# Patient Record
Sex: Female | Born: 1960 | Race: Black or African American | Hispanic: No | Marital: Single | Smoking: Never smoker
Health system: Southern US, Community
[De-identification: ages and names within clinical notes are randomized; demographics above are authoritative.]

## PROBLEM LIST (undated history)

## (undated) DIAGNOSIS — B351 Tinea unguium: Secondary | ICD-10-CM

## (undated) DIAGNOSIS — H269 Unspecified cataract: Secondary | ICD-10-CM

## (undated) DIAGNOSIS — F79 Unspecified intellectual disabilities: Secondary | ICD-10-CM

## (undated) DIAGNOSIS — J45909 Unspecified asthma, uncomplicated: Secondary | ICD-10-CM

## (undated) DIAGNOSIS — E119 Type 2 diabetes mellitus without complications: Secondary | ICD-10-CM

## (undated) DIAGNOSIS — D0512 Intraductal carcinoma in situ of left breast: Secondary | ICD-10-CM

## (undated) DIAGNOSIS — I10 Essential (primary) hypertension: Secondary | ICD-10-CM

## (undated) DIAGNOSIS — E538 Deficiency of other specified B group vitamins: Secondary | ICD-10-CM

## (undated) DIAGNOSIS — C50919 Malignant neoplasm of unspecified site of unspecified female breast: Secondary | ICD-10-CM

## (undated) DIAGNOSIS — E782 Mixed hyperlipidemia: Secondary | ICD-10-CM

## (undated) DIAGNOSIS — F209 Schizophrenia, unspecified: Secondary | ICD-10-CM

## (undated) DIAGNOSIS — Z832 Family history of diseases of the blood and blood-forming organs and certain disorders involving the immune mechanism: Secondary | ICD-10-CM

## (undated) DIAGNOSIS — E118 Type 2 diabetes mellitus with unspecified complications: Secondary | ICD-10-CM

## (undated) DIAGNOSIS — Z923 Personal history of irradiation: Secondary | ICD-10-CM

## (undated) HISTORY — DX: Unspecified intellectual disabilities: F79

## (undated) HISTORY — DX: Schizophrenia, unspecified: F20.9

## (undated) HISTORY — DX: Essential (primary) hypertension: I10

## (undated) HISTORY — DX: Family history of diseases of the blood and blood-forming organs and certain disorders involving the immune mechanism: Z83.2

## (undated) HISTORY — DX: Type 2 diabetes mellitus without complications: E11.9

## (undated) HISTORY — DX: Mixed hyperlipidemia: E78.2

## (undated) HISTORY — DX: Deficiency of other specified B group vitamins: E53.8

## (undated) HISTORY — DX: Unspecified cataract: H26.9

## (undated) HISTORY — PX: ABDOMINAL HYSTERECTOMY: SHX81

## (undated) HISTORY — DX: Malignant neoplasm of unspecified site of unspecified female breast: C50.919

## (undated) HISTORY — DX: Unspecified asthma, uncomplicated: J45.909

---

## 2003-10-31 DIAGNOSIS — S92811B Other fracture of right foot, initial encounter for open fracture: Secondary | ICD-10-CM

## 2003-10-31 HISTORY — DX: Other fracture of right foot, initial encounter for open fracture: S92.811B

## 2004-09-05 ENCOUNTER — Ambulatory Visit: Payer: Self-pay | Admitting: Unknown Physician Specialty

## 2004-12-19 ENCOUNTER — Ambulatory Visit: Payer: Self-pay | Admitting: Unknown Physician Specialty

## 2004-12-28 ENCOUNTER — Ambulatory Visit: Payer: Self-pay | Admitting: Unknown Physician Specialty

## 2005-01-28 ENCOUNTER — Ambulatory Visit: Payer: Self-pay | Admitting: Unknown Physician Specialty

## 2005-02-27 ENCOUNTER — Ambulatory Visit: Payer: Self-pay | Admitting: Unknown Physician Specialty

## 2005-03-30 ENCOUNTER — Ambulatory Visit: Payer: Self-pay | Admitting: Unknown Physician Specialty

## 2005-04-29 ENCOUNTER — Ambulatory Visit: Payer: Self-pay | Admitting: Unknown Physician Specialty

## 2005-05-30 ENCOUNTER — Ambulatory Visit: Payer: Self-pay | Admitting: Unknown Physician Specialty

## 2005-06-30 ENCOUNTER — Ambulatory Visit: Payer: Self-pay | Admitting: Unknown Physician Specialty

## 2005-07-30 ENCOUNTER — Ambulatory Visit: Payer: Self-pay | Admitting: Unknown Physician Specialty

## 2005-09-14 ENCOUNTER — Ambulatory Visit: Payer: Self-pay | Admitting: Unknown Physician Specialty

## 2006-09-18 ENCOUNTER — Ambulatory Visit: Payer: Self-pay | Admitting: Unknown Physician Specialty

## 2007-10-01 ENCOUNTER — Ambulatory Visit: Payer: Self-pay | Admitting: Unknown Physician Specialty

## 2009-02-10 ENCOUNTER — Ambulatory Visit: Payer: Self-pay | Admitting: Unknown Physician Specialty

## 2010-03-10 ENCOUNTER — Ambulatory Visit: Payer: Self-pay | Admitting: Unknown Physician Specialty

## 2010-03-31 ENCOUNTER — Ambulatory Visit: Payer: Self-pay | Admitting: Unknown Physician Specialty

## 2012-08-15 ENCOUNTER — Ambulatory Visit: Payer: Self-pay | Admitting: Unknown Physician Specialty

## 2012-10-30 DIAGNOSIS — C50919 Malignant neoplasm of unspecified site of unspecified female breast: Secondary | ICD-10-CM

## 2012-10-30 HISTORY — PX: BREAST EXCISIONAL BIOPSY: SUR124

## 2012-10-30 HISTORY — PX: BREAST LUMPECTOMY: SHX2

## 2012-10-30 HISTORY — DX: Malignant neoplasm of unspecified site of unspecified female breast: C50.919

## 2012-10-30 HISTORY — PX: BREAST BIOPSY: SHX20

## 2012-11-26 ENCOUNTER — Ambulatory Visit: Payer: Self-pay | Admitting: Surgery

## 2013-05-27 ENCOUNTER — Ambulatory Visit: Payer: Self-pay | Admitting: Surgery

## 2013-06-16 ENCOUNTER — Ambulatory Visit: Payer: Self-pay | Admitting: Surgery

## 2013-06-24 LAB — PATHOLOGY REPORT

## 2013-07-15 ENCOUNTER — Ambulatory Visit: Payer: Self-pay | Admitting: Surgery

## 2013-07-15 LAB — HEPATIC FUNCTION PANEL A (ARMC)
Albumin: 3.7 g/dL (ref 3.4–5.0)
Bilirubin, Direct: <0.1 mg/dL (ref 0.00–0.20)
Bilirubin,Total: 0.3 mg/dL (ref 0.2–1.0)
SGOT(AST): 29 U/L (ref 15–37)
SGPT (ALT): 37 U/L (ref 12–78)

## 2013-07-15 LAB — CBC
HCT: 40 % (ref 35.0–47.0)
HGB: 13 g/dL (ref 12.0–16.0)
MCV: 79 fL — ABNORMAL LOW (ref 80–100)
Platelet: 331 10*3/uL (ref 150–440)
RBC: 5.07 10*6/uL (ref 3.80–5.20)
RDW: 15.7 % — ABNORMAL HIGH (ref 11.5–14.5)

## 2013-07-15 LAB — BASIC METABOLIC PANEL
BUN: 15 mg/dL (ref 7–18)
Calcium, Total: 9.6 mg/dL (ref 8.5–10.1)
Chloride: 105 mmol/L (ref 98–107)
EGFR (African American): >60
Glucose: 130 mg/dL — ABNORMAL HIGH (ref 65–99)
Osmolality: 278 (ref 275–301)
Potassium: 4 mmol/L (ref 3.5–5.1)

## 2013-08-05 ENCOUNTER — Ambulatory Visit: Payer: Self-pay | Admitting: Surgery

## 2013-08-26 ENCOUNTER — Ambulatory Visit: Payer: Self-pay | Admitting: Oncology

## 2013-08-30 ENCOUNTER — Ambulatory Visit: Payer: Self-pay | Admitting: Oncology

## 2013-09-29 ENCOUNTER — Ambulatory Visit: Payer: Self-pay | Admitting: Oncology

## 2013-10-14 LAB — CBC CANCER CENTER
Basophil %: 0.8 %
Eosinophil #: 0.1 x10 3/mm (ref 0.0–0.7)
Eosinophil %: 1.5 %
HCT: 38.2 % (ref 35.0–47.0)
Lymphocyte #: 1.2 x10 3/mm (ref 1.0–3.6)
Lymphocyte %: 14.7 %
MCHC: 31.8 g/dL — ABNORMAL LOW (ref 32.0–36.0)
MCV: 80 fL (ref 80–100)
Monocyte #: 0.7 x10 3/mm (ref 0.2–0.9)
Monocyte %: 9.3 %
Platelet: 284 x10 3/mm (ref 150–440)
RBC: 4.76 10*6/uL (ref 3.80–5.20)
RDW: 15.8 % — ABNORMAL HIGH (ref 11.5–14.5)
WBC: 8 x10 3/mm (ref 3.6–11.0)

## 2013-10-21 LAB — CBC CANCER CENTER
Basophil #: 0.1 x10 3/mm (ref 0.0–0.1)
HGB: 12.6 g/dL (ref 12.0–16.0)
Lymphocyte %: 13.7 %
MCH: 25.6 pg — ABNORMAL LOW (ref 26.0–34.0)
MCHC: 31.5 g/dL — ABNORMAL LOW (ref 32.0–36.0)
Monocyte %: 10.8 %
Platelet: 290 x10 3/mm (ref 150–440)
RDW: 16 % — ABNORMAL HIGH (ref 11.5–14.5)
WBC: 7.7 x10 3/mm (ref 3.6–11.0)

## 2013-10-30 ENCOUNTER — Ambulatory Visit: Payer: Self-pay | Admitting: Oncology

## 2013-11-04 LAB — CBC CANCER CENTER
Basophil #: 0 x10 3/mm (ref 0.0–0.1)
Basophil %: 0.5 %
Eosinophil #: 0.2 x10 3/mm (ref 0.0–0.7)
Eosinophil %: 2.3 %
HCT: 39.8 % (ref 35.0–47.0)
HGB: 12.7 g/dL (ref 12.0–16.0)
LYMPHS ABS: 0.9 x10 3/mm — AB (ref 1.0–3.6)
Lymphocyte %: 11.3 %
MCH: 25.8 pg — ABNORMAL LOW (ref 26.0–34.0)
MCHC: 31.9 g/dL — AB (ref 32.0–36.0)
MCV: 81 fL (ref 80–100)
MONOS PCT: 6.9 %
Monocyte #: 0.6 x10 3/mm (ref 0.2–0.9)
Neutrophil #: 6.6 x10 3/mm — ABNORMAL HIGH (ref 1.4–6.5)
Neutrophil %: 79 %
Platelet: 300 x10 3/mm (ref 150–440)
RBC: 4.92 10*6/uL (ref 3.80–5.20)
RDW: 15.8 % — ABNORMAL HIGH (ref 11.5–14.5)
WBC: 8.4 x10 3/mm (ref 3.6–11.0)

## 2013-11-11 LAB — CBC CANCER CENTER
Basophil #: 0 x10 3/mm (ref 0.0–0.1)
Basophil %: 0.6 %
EOS PCT: 2.9 %
Eosinophil #: 0.2 x10 3/mm (ref 0.0–0.7)
HCT: 38.9 % (ref 35.0–47.0)
HGB: 12.4 g/dL (ref 12.0–16.0)
LYMPHS ABS: 0.9 x10 3/mm — AB (ref 1.0–3.6)
LYMPHS PCT: 13.7 %
MCH: 25.4 pg — ABNORMAL LOW (ref 26.0–34.0)
MCHC: 31.8 g/dL — AB (ref 32.0–36.0)
MCV: 80 fL (ref 80–100)
Monocyte #: 0.7 x10 3/mm (ref 0.2–0.9)
Monocyte %: 9.7 %
Neutrophil #: 4.9 x10 3/mm (ref 1.4–6.5)
Neutrophil %: 73.1 %
Platelet: 297 x10 3/mm (ref 150–440)
RBC: 4.87 10*6/uL (ref 3.80–5.20)
RDW: 15.7 % — ABNORMAL HIGH (ref 11.5–14.5)
WBC: 6.7 x10 3/mm (ref 3.6–11.0)

## 2013-11-30 ENCOUNTER — Ambulatory Visit: Payer: Self-pay | Admitting: Oncology

## 2013-12-28 ENCOUNTER — Ambulatory Visit: Payer: Self-pay | Admitting: Oncology

## 2014-01-28 ENCOUNTER — Ambulatory Visit: Payer: Self-pay | Admitting: Oncology

## 2014-04-01 ENCOUNTER — Ambulatory Visit: Payer: Self-pay | Admitting: Surgery

## 2014-04-02 ENCOUNTER — Ambulatory Visit: Payer: Self-pay | Admitting: Oncology

## 2014-04-29 ENCOUNTER — Ambulatory Visit: Payer: Self-pay | Admitting: Oncology

## 2014-06-03 ENCOUNTER — Ambulatory Visit: Payer: Self-pay | Admitting: Oncology

## 2014-06-30 ENCOUNTER — Ambulatory Visit: Payer: Self-pay | Admitting: Oncology

## 2014-07-03 LAB — CANCER ANTIGEN 27.29: CA 27.29: 16.2 U/mL (ref 0.0–38.6)

## 2014-07-30 ENCOUNTER — Ambulatory Visit: Payer: Self-pay | Admitting: Oncology

## 2014-10-01 ENCOUNTER — Ambulatory Visit: Payer: Self-pay | Admitting: Oncology

## 2014-10-02 LAB — CANCER ANTIGEN 27.29: CA 27.29: 21.3 U/mL (ref 0.0–38.6)

## 2014-10-30 ENCOUNTER — Ambulatory Visit: Payer: Self-pay | Admitting: Oncology

## 2015-01-06 ENCOUNTER — Ambulatory Visit
Admit: 2015-01-06 | Disposition: A | Discharge status: Home / Self Care | Payer: Self-pay | Attending: Oncology | Admitting: Oncology

## 2015-01-29 ENCOUNTER — Ambulatory Visit
Admit: 2015-01-29 | Disposition: A | Discharge status: Home / Self Care | Payer: Self-pay | Attending: Oncology | Admitting: Oncology

## 2015-02-19 NOTE — Consult Note (Signed)
Reason for Visit: This 54 year old Female patient presents to the clinic for initial evaluation of  breast cancer .   Referred by Dr. Tamala Julian.  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  Pathology Report pathology reports reviewed   Imaging Report mammograms reviewed   Referral Report clinical notes reviewed   Planned Treatment Regimen whole breast radiation   HPI   patient is a pleasant34 year old femalewho presented with asymmetry within the right breast slightly superior to the retroareolar region confirmed on ultrasound.surgical consultation was recommended. She underwent stereotactic biopsy which was positive for invasive mammary carcinoma. Went on to have a wide local excision for a 1.4 cm grade 1 invasive mammary carcinoma ER/PR positive. Margins were clear at 6 mm. One sentinel node had a macro metastatic focus of metastatic disease. She has done well postoperatively. Has been referred to medical oncology although chemotherapy has been deferred and based on his significant mental disabilities. She still having some slight right breast pain post surgery although she is healing well. She seen today accompanied by her mother and nurse navigator.  Past, Family and Social History:  Past Medical History positive   Cardiovascular hypertension   Endocrine diabetes mellitus   Neurological/Psychiatric slight mental retardation, schizophrenia   Past Surgical History cataract left eye, hysterectomy, right foot fracture   Past Medical History Comments anemia   Family History positive   Family History Comments  amily history of diabetes and hypertension   Social History noncontributory   Additional Past Medical and Surgical History accompanied by nurse navigator and her mother   Allergies:   No Known Allergies:   Home Meds:  Home Medications: Medication Instructions Status  fluticasone nasal 50 mcg/inh nasal spray 2 spray(s) each nostril once a day (in the morning) Active  acetaminophen 500 mg oral tablet 1 tab(s) orally every 6 hours, As Needed Active  glimepiride 4 mg oral tablet 1 tab(s) orally once a day (in the morning) and 1/2 tab at [facility] Active  metFORMIN 1000 mg oral tablet 1 tab(s) orally once a day (in the morning) and 1 and 1/2 tabs at 6PM Active  trihexyphenidyl 2 mg oral tablet 1 tab(s) orally once a day (at bedtime) Active  enalapril 5 mg oral tablet 1 tab(s) orally once a day (in the morning) Active  Tradjenta 5 mg oral tablet 1 tab(s) orally once a day (in the morning) Active  furosemide 20 mg oral tablet 1 tab(s) orally once a day (in the morning) Active   Review of Systems:  General see HPI   Performance Status (ECOG) 1   Skin negative   Breast see HPI   Ophthalmologic negative   ENMT negative   Respiratory and Thorax negative   Cardiovascular negative   Gastrointestinal negative   Genitourinary negative   Musculoskeletal negative   Neurological negative   Psychiatric negative   Hematology/Lymphatics negative   Endocrine negative   Allergic/Immunologic negative   Review of Systems   review of systems obtained from nurse's notes  Nursing Notes:  Nursing Vital Signs and Chemo Nursing Nursing Notes: *CC Vital Signs Flowsheet:   28-Oct-14 11:39  Temp Temperature 98.9  Pulse Pulse 101  Respirations Respirations 20  SBP SBP 124  DBP DBP 85  Pain Scale (0-10)  0  Current Weight (kg) (kg) 141.3  Height (cm) centimeters 165  BSA (m2) 2.3   Physical Exam:  General/Skin/HEENT:  Skin normal   Eyes normal   ENMT normal   Head and Neck  normal   Additional PE morbidly obese female with large pendulous breasts in NAD. Lungs are clear to A&P cardiac examination shows regular rate and rhythm. She status post wide local excision of the right breast with sacrifice of the nipple areolar complex. No dominant mass or nodularity is noted in either breast into position examined. No axillary or supraclavicular adenopathy is identified. Abdomen is obese with no organomegaly or masses noted.   Breasts/Resp/CV/GI/GU:  Respiratory and Thorax normal   Cardiovascular normal   Gastrointestinal normal   Genitourinary normal   MS/Neuro/Psych/Lymph:  Musculoskeletal normal   Neurological normal   Lymphatics normal   Other Results:  Radiology Results: LabUnknown:    29-Jul-14 09:32, Digital Unilateral Right Breast  PACS [place]:  Digital Unilateral Right Breast   REASON FOR EXAM:    RT BRST MASS FU  COMMENTS:       PROCEDURE: MAM - MAM DGTL UNI MAM RT BREAST W/CAD  - May 27 2013  9:32AM     RESULT:  Unilateral right breast mammogram dated 05/27/2013    Findings:    Comparison: 08/15/2012.    The previous described area of asymmetric density with spiculation within   the medial slightly superior retroareolar portion of the right breast is   once again appreciated. This area appears slightly more dense when   compared to the previous study. There does not appear to be a definite     sonographic correlation. Surgical consultation again is recommended.   Tissue sampling options are stereotactic biopsy versus needle   localization and removal. The area is not consistently appreciated on   ultrasound.    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 Plan:   based on the fact her sentinel lymph node was involved would like to treat her whole breast and peripheral lymphatics to 5000 cGy. Would also boost or scar another 1600 cGy based on the close margin. I have explained the risks and benefits of treatment both the patient and her mother. Side effects including skin reaction which may be accentuated based on her large sized breasts, fatigue, inclusion of some superficial lung, and alteration possibly her blood counts were all explained in detail. I have set patient up for CT simulation next week. Patient will also be a candidate for aromatase inhibitorafter completion of radiation.  I would like to take this opportunity to thank you for allowing me to continue to participate in this patient's care.  CC Referral:  cc: Dr. Kem Kays   Electronic Signatures: Baruch Gouty, Roda Shutters (MD)  (  Signed 03-Nov-14 15:29)  Authored: HPI, Diagnosis, PFSH, Allergies, Home Meds, ROS, Nursing Notes, Physical Exam, Other Results, Relevent Results, Encounter Assessment and Plan, CC Referring Physician   Last Updated: 03-Nov-14 15:29 by Armstead Peaks (MD)

## 2015-02-19 NOTE — Op Note (Signed)
PATIENT NAME:  Deborah Castillo, RIFFE MR#:  [id number] DATE OF BIRTH:  1961-01-17  DATE OF PROCEDURE:  08/05/2013  PREOPERATIVE DIAGNOSIS: Carcinoma of the right breast.   POSTOPERATIVE DIAGNOSIS: Carcinoma of the right breast.   PROCEDURE: Right partial mastectomy with axillary sentinel lymph node biopsy.   SURGEON: Rochel Brome, M.D.   ANESTHESIA: General.   INDICATIONS: This 54 year old female recently had a mammogram depicting a stellate-appearing mass in the upper inner quadrant, right breast, near the 12 o'clock position. The lesion was studied with stereotactic biopsy which demonstrated invasive mammary carcinoma. Surgery was recommended for definitive treatment. She did have preoperative x-ray needle localization and preoperative injection of radioactive technetium sulfur colloid.   DESCRIPTION OF PROCEDURE: The patient was placed on the operating table in the supine position under general anesthesia. The dressing was removed from the upper aspect, right breast, exposing the Kopans wire which was cut 2 cm from the skin. The right arm was placed on a lateral arm support. The right breast, axilla, upper arm, and chest wall were prepared with ChloraPrep and draped in a sterile manner.   The axilla was probed with a gamma counter, demonstrating location of radioactivity in the inferior aspect of the axilla. An obliquely oriented 6 cm incision was made in the inferior aspect of the axilla and carried down through subcutaneous tissues. One traversing vein was divided between 4-0 chromic suture ligatures. Dissection was carried down through superficial fascia down deeply within the axilla adjacent to the chest wall where lymph node was found with the gamma counter and was dissected free from surrounding structures. It was approximately 1.5 cm in dimension and was soft and round and was excised with electrocautery and submitted for routine pathology.   It is noted that the ex vivo count was greater than  700 counts per second. The background count was less than 9 counts per second. The wound was inspected. Hemostasis was intact.   Next, attention was turned to do the right partial mastectomy. The mammogram images were reviewed, demonstrating the proximity of the Kopans wire to the biopsy marker and density.   A curvilinear incision was made from 11 o'clock to 1 o'clock position some 12 cm from the nipple and removed an ellipse of skin with the course of dissection, and dissection was carried down through subcutaneous tissues down deeply within the breast, and a portion of tissue approximately 5 x 5 x 5 cm was removed although it was not spherical but elliptically oriented. The specimen was further inspected. There was no tumor seen at the cut margins. The specimen was marked so that a stitch was placed at the 1 o'clock position of the skin ellipse and margin maps were sutured to the specimen with 3-0 nylon to mark the medial, lateral, cranial, caudal, and deep margins.   The axillary wound was inspected and hemostasis appeared to be intact. The subcutaneous tissues were infiltrated with 0.5% Sensorcaine with epinephrine, and the wound was closed with a running 4-0 Monocryl subcuticular suture.   Attention was turned to the partial mastectomy wound. Another ellipse of skin on the inferior border was excised to assist with closure and did not need to send this for pathology.   Next, subcutaneous tissues were closed with 4-0 chromic. Subcutaneous tissues were also infiltrated with 0.5% Sensorcaine with epinephrine. The skin was closed with a running 4-0 Monocryl subcuticular suture.   The radiology report called to report that the biopsy marker was found in the specimen in satisfactory location,  and the pathologist later called to report that the margins appeared to be clear.   Both wounds were treated with Dermabond. The patient tolerated surgery satisfactorily and was then prepared for transfer to the  recovery room.    ____________________________ Lenna Sciara. Rochel Brome, MD jws:np D: 08/05/2013 16:34:27 ET T: 08/05/2013 18:25:23 ET JOB#: [id number]  cc: Loreli Dollar, MD, <Dictator> Loreli Dollar MD ELECTRONICALLY SIGNED 08/06/2013 18:19

## 2015-04-05 ENCOUNTER — Other Ambulatory Visit: Payer: Self-pay | Admitting: Surgery

## 2015-04-05 DIAGNOSIS — Z853 Personal history of malignant neoplasm of breast: Secondary | ICD-10-CM

## 2015-04-15 ENCOUNTER — Other Ambulatory Visit: Payer: Self-pay

## 2015-04-15 ENCOUNTER — Ambulatory Visit: Payer: Self-pay

## 2015-04-28 ENCOUNTER — Inpatient Hospital Stay: Payer: Self-pay | Attending: Oncology | Admitting: Oncology

## 2015-05-10 ENCOUNTER — Inpatient Hospital Stay: Payer: Medicare Other

## 2015-05-10 ENCOUNTER — Encounter: Payer: Self-pay | Admitting: Oncology

## 2015-05-10 ENCOUNTER — Inpatient Hospital Stay: Payer: Medicare Other | Attending: Oncology | Admitting: Oncology

## 2015-05-10 VITALS — BP 132/82 | HR 82 | Temp 94.3°F | Resp 18 | Wt 293.6 lb

## 2015-05-10 DIAGNOSIS — Z923 Personal history of irradiation: Secondary | ICD-10-CM | POA: Diagnosis not present

## 2015-05-10 DIAGNOSIS — E538 Deficiency of other specified B group vitamins: Secondary | ICD-10-CM | POA: Insufficient documentation

## 2015-05-10 DIAGNOSIS — F209 Schizophrenia, unspecified: Secondary | ICD-10-CM | POA: Diagnosis not present

## 2015-05-10 DIAGNOSIS — Z170 Estrogen receptor positive status [ER+]: Secondary | ICD-10-CM | POA: Diagnosis not present

## 2015-05-10 DIAGNOSIS — C50911 Malignant neoplasm of unspecified site of right female breast: Secondary | ICD-10-CM | POA: Diagnosis present

## 2015-05-10 DIAGNOSIS — Z79811 Long term (current) use of aromatase inhibitors: Secondary | ICD-10-CM | POA: Diagnosis not present

## 2015-05-10 DIAGNOSIS — E119 Type 2 diabetes mellitus without complications: Secondary | ICD-10-CM | POA: Insufficient documentation

## 2015-05-10 DIAGNOSIS — I10 Essential (primary) hypertension: Secondary | ICD-10-CM | POA: Insufficient documentation

## 2015-05-10 NOTE — Progress Notes (Signed)
Patient is here to discuss a discomfort feeling on her right breast that she has been feeling for a while but has increased over the last few weeks.

## 2015-05-20 ENCOUNTER — Ambulatory Visit: Payer: Medicare Other

## 2015-05-20 ENCOUNTER — Ambulatory Visit
Admission: RE | Admit: 2015-05-20 | Discharge: 2015-05-20 | Disposition: A | Discharge status: Home / Self Care | Payer: Medicare Other | Source: Ambulatory Visit | Attending: Surgery | Admitting: Surgery

## 2015-05-20 DIAGNOSIS — D249 Benign neoplasm of unspecified breast: Secondary | ICD-10-CM | POA: Diagnosis not present

## 2015-05-20 DIAGNOSIS — Z853 Personal history of malignant neoplasm of breast: Secondary | ICD-10-CM | POA: Insufficient documentation

## 2015-05-24 DIAGNOSIS — C50919 Malignant neoplasm of unspecified site of unspecified female breast: Secondary | ICD-10-CM | POA: Insufficient documentation

## 2015-05-24 NOTE — Progress Notes (Signed)
[place]  Telephone:([phone] Fax:([phone]  ID: Lynne Leader OB: [birth year]  MR#: [id number]  TIW#:[id number]  Patient Care Team: Lorenza Evangelist, MD as PCP - General (Endocrinology)  CHIEF COMPLAINT:  Chief Complaint  Patient presents with  . Follow-up    breast cancer    INTERVAL HISTORY: Patient returns to clinic today for routine 6 month evaluation. She is tolerating letrozole well without significant side effects. Currently, she feels well and is asymptomatic. She has no neurologic complaints.  She denies any recent fevers.  She has a good appetite and denies weight loss.  She denies any chest pain or shortness of breath.  She denies any nausea, vomiting, constipation, or diarrhea.  She has no urinary complaints.  Patient offers no specific complaints today.  REVIEW OF SYSTEMS:   Review of Systems  Constitutional: Negative.   Respiratory: Negative.   Cardiovascular: Negative.   Musculoskeletal: Negative.     As per HPI. Otherwise, a complete review of systems is negatve.  PAST MEDICAL HISTORY: Past Medical History  Diagnosis Date  . Diabetes mellitus without complication   . Mixed hyperlipidemia   . Family history of iron deficiency anemia   . Mental retardation   . Schizophrenia   . Asthma   . Cataract   . Hypertension   . B12 deficiency   . Breast cancer 2014    with radiation    PAST SURGICAL HISTORY: Past Surgical History  Procedure Laterality Date  . Abdominal hysterectomy    . Mastectomy, partial    . Breast excisional biopsy Right 2014    FAMILY HISTORY:  Diabetes and hypertension.     ADVANCED DIRECTIVES:    HEALTH MAINTENANCE: History  Substance Use Topics  . Smoking status: Never Smoker   . Smokeless tobacco: Never Used  . Alcohol Use: No     Colonoscopy:  PAP:  Bone density:  Lipid panel:  No Known Allergies  Current Outpatient Prescriptions  Medication Sig Dispense Refill  . Acetaminophen  500 MG coapsule Take by mouth.    Marland Kitchen aspirin EC 81 MG tablet Take by mouth.    Marland Kitchen azelastine (ASTELIN) 0.1 % nasal spray Place into the nose.    . cyanocobalamin (,VITAMIN B-12,) 1000 MCG/ML injection Inject into the muscle.    . docusate sodium (STOOL SOFTENER) 100 MG capsule Take by mouth.    . enalapril (VASOTEC) 10 MG tablet     . fluPHENAZine (PROLIXIN) 5 MG tablet     . furosemide (LASIX) 20 MG tablet     . glimepiride (AMARYL) 4 MG tablet     . glucose blood (TRUETEST TEST) test strip TEST TWICE DAILY    . letrozole (FEMARA) 2.5 MG tablet Take by mouth.    . metFORMIN (GLUCOPHAGE) 1000 MG tablet     . moxifloxacin (VIGAMOX) 0.5 % ophthalmic solution     . Multiple Vitamin (MULTI-VITAMINS) TABS Take by mouth.    . TRADJENTA 5 MG TABS tablet      No current facility-administered medications for this visit.    OBJECTIVE: Filed Vitals:   05/10/15 1015  BP: 132/82  Pulse: 82  Temp: 94.3 F (34.6 C)  Resp: 18     There is no height on file to calculate BMI.    ECOG FS:0 - Asymptomatic  General: Well-developed, well-nourished, no acute distress. Eyes: anicteric sclera. Breasts: Exam deferred today. Lungs: Clear to auscultation bilaterally. Heart: Regular rate and rhythm. No rubs, murmurs, or gallops. Abdomen:  Soft, nontender, nondistended. No organomegaly noted, normoactive bowel sounds. Musculoskeletal: No edema, cyanosis, or clubbing. Neuro: Alert, answering all questions appropriately. Cranial nerves grossly intact. Skin: No rashes or petechiae noted. Psych: Normal affect.    LAB RESULTS:  Lab Results  Component Value Date   NA 138 07/15/2013   K 4.0 07/15/2013   CL 105 07/15/2013   CO2 28 07/15/2013   GLUCOSE 130* 07/15/2013   BUN 15 07/15/2013   CREATININE 0.85 07/15/2013   CALCIUM 9.6 07/15/2013   PROT 7.9 07/15/2013   ALBUMIN 3.7 07/15/2013   AST 29 07/15/2013   ALT 37 07/15/2013   ALKPHOS 93 07/15/2013   BILITOT 0.3 07/15/2013   GFRNONAA >60  07/15/2013   GFRAA >60 07/15/2013    Lab Results  Component Value Date   WBC 6.7 11/11/2013   NEUTROABS 4.9 11/11/2013   HGB 12.4 11/11/2013   HCT 38.9 11/11/2013   MCV 80 11/11/2013   PLT 297 11/11/2013     STUDIES: Mm Diag Breast Tomo Bilateral  05/20/2015   CLINICAL DATA:  Personal history of right breast cancer status post lumpectomy 2014  EXAM: DIGITAL DIAGNOSTIC BILATERAL MAMMOGRAM WITH 3D TOMOSYNTHESIS AND CAD  COMPARISON:  Previous exam(s).  ACR Breast Density Category b: There are scattered areas of fibroglandular density.  FINDINGS: Cc and MLO views of bilateral breasts, spot tangential view of right breast are submitted. Postsurgical changes are identified within the right breast. The left breasts is negative.  Mammographic images were processed with CAD.  IMPRESSION: Benign findings.  RECOMMENDATION: Bilateral diagnostic mammogram in 1 year.  I have discussed the findings and recommendations with the patient. Results were also provided in writing at the conclusion of the visit. If applicable, a reminder letter will be sent to the patient regarding the next appointment.  BI-RADS CATEGORY  2: Benign.   Electronically Signed   By: Abelardo Diesel M.D.   On: 05/20/2015 13:48    ASSESSMENT: Stage IIa ER/PR positive HER-2 negative adenocarcinoma of the right breast.   PLAN:   1.  Breast cancer: This stage of breast cancer typically requires adjuvant chemotherapy.  Given patient's decreased mental capacity and likely inability to consent or understand the treatment plan, she did not receive adjuvant chemotherapy. Her most recent mammogram on May 20, 2015 was reported as BI-RADS 2. Repeat in July 2017.  She also had a bone mineral density on January 15, 2014 that was reported as normal.  Continue with letrozole which she will take daily for 5 years completing in March of 2020. Return to clinic in 6 months with repeat laboratory work and further evaluation. Patient's mother expressed  understanding and was in agreement with this plan.    Patient expressed understanding and was in agreement with this plan. She also understands that She can call clinic at any time with any questions, concerns, or complaints.    Lloyd Huger, MD   05/24/2015 6:16 PM

## 2015-06-17 ENCOUNTER — Inpatient Hospital Stay: Payer: Medicare Other | Attending: Oncology | Admitting: Oncology

## 2015-06-23 ENCOUNTER — Telehealth: Payer: Self-pay | Admitting: *Deleted

## 2015-06-23 NOTE — Telephone Encounter (Signed)
Called to request an appt for Sept for intermittent left breast pain (same side she had surgery on). Appt given for 9/8 for Lucianne Lei and md. She said her mother was writing  Down appt

## 2015-07-08 ENCOUNTER — Ambulatory Visit: Payer: Medicare Other | Admitting: Oncology

## 2015-07-15 ENCOUNTER — Ambulatory Visit: Payer: Self-pay | Admitting: Oncology

## 2015-07-15 ENCOUNTER — Inpatient Hospital Stay: Payer: Medicare Other | Attending: Family Medicine

## 2015-07-15 ENCOUNTER — Inpatient Hospital Stay: Payer: Medicare Other | Admitting: Oncology

## 2015-07-15 NOTE — Progress Notes (Signed)
Patient comes in today concerned with right breast pain.

## 2015-07-15 NOTE — Progress Notes (Signed)
This encounter was created in error - please disregard.

## 2015-11-04 ENCOUNTER — Other Ambulatory Visit: Payer: Self-pay | Admitting: Oncology

## 2015-11-11 ENCOUNTER — Inpatient Hospital Stay: Payer: Medicare Other | Admitting: Oncology

## 2015-11-22 ENCOUNTER — Inpatient Hospital Stay: Payer: Medicare Other

## 2015-11-22 ENCOUNTER — Inpatient Hospital Stay: Payer: Medicare Other | Attending: Oncology | Admitting: Oncology

## 2015-11-22 VITALS — BP 129/88 | HR 101 | Temp 99.5°F | Resp 18 | Wt 282.0 lb

## 2015-11-22 DIAGNOSIS — C50911 Malignant neoplasm of unspecified site of right female breast: Secondary | ICD-10-CM | POA: Insufficient documentation

## 2015-11-22 DIAGNOSIS — F209 Schizophrenia, unspecified: Secondary | ICD-10-CM | POA: Diagnosis not present

## 2015-11-22 DIAGNOSIS — Z7982 Long term (current) use of aspirin: Secondary | ICD-10-CM | POA: Insufficient documentation

## 2015-11-22 DIAGNOSIS — Z170 Estrogen receptor positive status [ER+]: Secondary | ICD-10-CM | POA: Diagnosis not present

## 2015-11-22 DIAGNOSIS — Z923 Personal history of irradiation: Secondary | ICD-10-CM | POA: Insufficient documentation

## 2015-11-22 DIAGNOSIS — E782 Mixed hyperlipidemia: Secondary | ICD-10-CM | POA: Insufficient documentation

## 2015-11-22 DIAGNOSIS — Z79811 Long term (current) use of aromatase inhibitors: Secondary | ICD-10-CM | POA: Diagnosis not present

## 2015-11-22 DIAGNOSIS — E119 Type 2 diabetes mellitus without complications: Secondary | ICD-10-CM | POA: Diagnosis not present

## 2015-11-22 DIAGNOSIS — E538 Deficiency of other specified B group vitamins: Secondary | ICD-10-CM | POA: Insufficient documentation

## 2015-11-22 DIAGNOSIS — I10 Essential (primary) hypertension: Secondary | ICD-10-CM | POA: Diagnosis not present

## 2015-11-22 DIAGNOSIS — Z79899 Other long term (current) drug therapy: Secondary | ICD-10-CM | POA: Insufficient documentation

## 2015-11-22 DIAGNOSIS — J029 Acute pharyngitis, unspecified: Secondary | ICD-10-CM | POA: Insufficient documentation

## 2015-11-22 DIAGNOSIS — F79 Unspecified intellectual disabilities: Secondary | ICD-10-CM | POA: Diagnosis not present

## 2015-11-22 DIAGNOSIS — Z7984 Long term (current) use of oral hypoglycemic drugs: Secondary | ICD-10-CM | POA: Diagnosis not present

## 2015-11-22 DIAGNOSIS — J45909 Unspecified asthma, uncomplicated: Secondary | ICD-10-CM | POA: Diagnosis not present

## 2015-11-22 NOTE — Progress Notes (Signed)
Patient states she has had a sore throat for past few days.  Temp 99.5  States her right ear has been bleeding some.  Patient has had 11 # weight loss since last visit in July.

## 2015-11-24 NOTE — Progress Notes (Signed)
[place]  Telephone:(336) [phone] Fax:(336) [phone]  ID: Deborah Castillo OB: 12/15/1960  MR#: [id number]  XLK#:[id number]  Patient Care Team: Lorenza Evangelist, MD as PCP - General (Endocrinology)  CHIEF COMPLAINT:  Chief Complaint  Patient presents with  . Breast Cancer    INTERVAL HISTORY: Patient returns to clinic today for routine 6 month evaluation. She is tolerating letrozole well without significant side effects. She is not accompanied by her mother today.  She is complains of low grade fever and sore throat today.  She also has had some weight loss in the interm, but states she has been walking more. She has no neurologic complaints.  She denies any recent fevers.  She has a good appetite.  She denies any chest pain or shortness of breath.  She denies any nausea, vomiting, constipation, or diarrhea.  She has no urinary complaints.  Patient offers no further specific complaints today.  REVIEW OF SYSTEMS:   Review of Systems  Constitutional: Positive for weight loss. Negative for fever and malaise/fatigue.  HENT: Positive for sore throat.   Respiratory: Negative.   Cardiovascular: Negative.   Gastrointestinal: Negative.   Genitourinary: Negative.   Musculoskeletal: Negative.   Neurological: Negative.  Negative for weakness.    As per HPI. Otherwise, a complete review of systems is negatve.  PAST MEDICAL HISTORY: Past Medical History  Diagnosis Date  . Diabetes mellitus without complication   . Mixed hyperlipidemia   . Family history of iron deficiency anemia   . Mental retardation   . Schizophrenia   . Asthma   . Cataract   . Hypertension   . B12 deficiency   . Breast cancer 2014    with radiation    PAST SURGICAL HISTORY: Past Surgical History  Procedure Laterality Date  . Abdominal hysterectomy    . Mastectomy, partial    . Breast excisional biopsy Right 2014    FAMILY HISTORY:  Diabetes and hypertension.     ADVANCED DIRECTIVES:      HEALTH MAINTENANCE: Social History  Substance Use Topics  . Smoking status: Never Smoker   . Smokeless tobacco: Never Used  . Alcohol Use: No     Colonoscopy:  PAP:  Bone density:  Lipid panel:  No Known Allergies  Current Outpatient Prescriptions  Medication Sig Dispense Refill  . Acetaminophen 500 MG coapsule Take by mouth.    Marland Kitchen aspirin EC 81 MG tablet Take by mouth.    Marland Kitchen azelastine (ASTELIN) 0.1 % nasal spray Place into the nose.    . cyanocobalamin (,VITAMIN B-12,) 1000 MCG/ML injection Inject into the muscle.    . docusate sodium (STOOL SOFTENER) 100 MG capsule Take by mouth.    . enalapril (VASOTEC) 10 MG tablet     . fluPHENAZine (PROLIXIN) 1 MG tablet     . furosemide (LASIX) 20 MG tablet     . glimepiride (AMARYL) 4 MG tablet     . letrozole (FEMARA) 2.5 MG tablet TAKE ONE TABLET BY MOUTH EVERY DAY 90 tablet 3  . metFORMIN (GLUCOPHAGE) 1000 MG tablet     . moxifloxacin (VIGAMOX) 0.5 % ophthalmic solution     . Multiple Vitamin (MULTI-VITAMINS) TABS Take by mouth.    . TRADJENTA 5 MG TABS tablet      No current facility-administered medications for this visit.    OBJECTIVE: Filed Vitals:   11/22/15 1101  BP: 129/88  Pulse: 101  Temp: 99.5 F (37.5 C)  Resp: 18  There is no height on file to calculate BMI.    ECOG FS:0 - Asymptomatic  General: Well-developed, well-nourished, no acute distress. Eyes: anicteric sclera. Breasts: Exam deferred today. Lungs: Clear to auscultation bilaterally. Heart: Regular rate and rhythm. No rubs, murmurs, or gallops. Abdomen: Soft, nontender, nondistended. No organomegaly noted, normoactive bowel sounds. Musculoskeletal: No edema, cyanosis, or clubbing. Neuro: Alert, answering all questions appropriately. Cranial nerves grossly intact. Skin: No rashes or petechiae noted. Psych: Normal affect.    LAB RESULTS:  Lab Results  Component Value Date   NA 138 07/15/2013   K 4.0 07/15/2013   CL 105 07/15/2013    CO2 28 07/15/2013   GLUCOSE 130* 07/15/2013   BUN 15 07/15/2013   CREATININE 0.85 07/15/2013   CALCIUM 9.6 07/15/2013   PROT 7.9 07/15/2013   ALBUMIN 3.7 07/15/2013   AST 29 07/15/2013   ALT 37 07/15/2013   ALKPHOS 93 07/15/2013   BILITOT 0.3 07/15/2013   GFRNONAA >60 07/15/2013   GFRAA >60 07/15/2013    Lab Results  Component Value Date   WBC 6.7 11/11/2013   NEUTROABS 4.9 11/11/2013   HGB 12.4 11/11/2013   HCT 38.9 11/11/2013   MCV 80 11/11/2013   PLT 297 11/11/2013     STUDIES: No results found.  ASSESSMENT: Stage IIa ER/PR positive HER-2 negative adenocarcinoma of the right breast.   PLAN:     1.  Breast cancer: This stage of breast cancer typically requires adjuvant chemotherapy.  Given patient's decreased mental capacity and likely inability to consent or understand the treatment plan, she did not receive adjuvant chemotherapy. Her most recent mammogram on May 20, 2015 was reported as BI-RADS 2. Repeat in July 2017.  She also had a bone mineral density on January 15, 2014 that was reported as normal.  Continue with letrozole which she will take daily for 5 years completing in March of 2020. Return to clinic in 6 months with repeat laboratory work and further evaluation.  2. Sore throat: Continue symptomatic treatment with OTC medications. 3. Weight loss: Appears to be intentional, monitor.    Patient expressed understanding and was in agreement with this plan. She also understands that She can call clinic at any time with any questions, concerns, or complaints.    Lloyd Huger, MD   11/24/2015 10:02 PM

## 2016-01-06 ENCOUNTER — Ambulatory Visit: Payer: Medicare Other | Admitting: Radiation Oncology

## 2016-01-27 ENCOUNTER — Ambulatory Visit
Admission: RE | Admit: 2016-01-27 | Discharge: 2016-01-27 | Disposition: A | Discharge status: Home / Self Care | Payer: Medicare Other | Source: Ambulatory Visit | Attending: Radiation Oncology | Admitting: Radiation Oncology

## 2016-01-27 ENCOUNTER — Other Ambulatory Visit: Payer: Self-pay | Admitting: *Deleted

## 2016-01-27 ENCOUNTER — Encounter: Payer: Self-pay | Admitting: Radiation Oncology

## 2016-01-27 VITALS — Temp 98.1°F | Wt 282.3 lb

## 2016-01-27 DIAGNOSIS — C50911 Malignant neoplasm of unspecified site of right female breast: Secondary | ICD-10-CM

## 2016-01-27 MED ORDER — LANSOPRAZOLE 30 MG PO CPDR: 30.0000 mg | DELAYED_RELEASE_CAPSULE | Freq: Every day | ORAL | Status: DC

## 2016-01-27 NOTE — Progress Notes (Signed)
Radiation Oncology Follow up Note  Name: Deborah Castillo   Date:   01/27/2016 MRN:  [id number] DOB: 1960/11/01    This 55 y.o. 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  REFERRING PROVIDER: Lorenza Evangelist, MD  HPI: Patient is a 55 year old female with morbid obesity and cognitive impairment who is now 2 years out from whole breast radiation to her right breast for a stage IIa invasive mammary carcinoma low-grade ER/PR positive. She did not receive chemotherapy chemotherapy based on her ability to tolerate based on her mental capacity.. She seen today in routine follow-up and is doing well. She does have some exacerbation of arthritis she's currently on letrozole tolerating that well without side effect. She does complain of some hoarseness and GI upset and I have recommended a proton pump inhibitor. She had a mammogram back in June 2015 which was fine BI-RADS 2 and she scheduled for another one in July 2017. She specifically denies breast tenderness cough or bone pain.  COMPLICATIONS OF TREATMENT: none  FOLLOW UP COMPLIANCE: keeps appointments   PHYSICAL EXAM:  Temp(Src) 98.1 F (36.7 C)  Wt 282 lb 4.8 oz (128.05 kg) Well-developed obese female in NAD. Right breast is somewhat fibrotic although no dominant mass or nodularity is noted in either breast in 2 positions examined. No axillary or supraclavicular adenopathy is identified. Well-developed well-nourished patient in NAD. HEENT reveals PERLA, EOMI, discs not visualized.  Oral cavity is clear. No oral mucosal lesions are identified. Neck is clear without evidence of cervical or supraclavicular adenopathy. Lungs are clear to A&P. Cardiac examination is essentially unremarkable with regular rate and rhythm without murmur rub or thrill. Abdomen is benign with no organomegaly or masses noted. Motor sensory and DTR levels  are equal and symmetric in the upper and lower extremities. Cranial nerves II through XII are grossly intact. Proprioception is intact. No peripheral adenopathy or edema is identified. No motor or sensory levels are noted. Crude visual fields are within normal range.  RADIOLOGY RESULTS: Most recent mammograms are reviewed  PLAN: At the present time she is doing well with no evidence of disease. I'm please were overall progress. I have asked to see her back in 1 year for follow-up. She continues on letrozole without side effect. I've given her prescription for Prevacid to take 1 a day for the next several weeks. This may help with some of her GI upset. Also patient will continue with her mammograms in July.  I would like to take this opportunity for allowing me to participate in the care of your patient.Armstead Peaks., MD

## 2016-05-24 ENCOUNTER — Ambulatory Visit
Admission: RE | Admit: 2016-05-24 | Discharge: 2016-05-24 | Disposition: A | Discharge status: Home / Self Care | Payer: Medicare Other | Source: Ambulatory Visit | Attending: Oncology | Admitting: Oncology

## 2016-05-24 ENCOUNTER — Other Ambulatory Visit: Payer: Self-pay | Admitting: Oncology

## 2016-05-24 ENCOUNTER — Other Ambulatory Visit: Payer: Medicare Other

## 2016-05-24 ENCOUNTER — Ambulatory Visit: Payer: Medicare Other | Admitting: Oncology

## 2016-05-24 DIAGNOSIS — Z923 Personal history of irradiation: Secondary | ICD-10-CM | POA: Insufficient documentation

## 2016-05-24 DIAGNOSIS — C50911 Malignant neoplasm of unspecified site of right female breast: Secondary | ICD-10-CM

## 2016-05-30 NOTE — Progress Notes (Signed)
[name]  [phone] [phone]  ID: Deborah Castillo OB: [birth year]  MR#: [id number]  HYQ#:[id number]  Patient Care Team: Lorenza Evangelist, MD as PCP - General (Endocrinology)  CHIEF COMPLAINT: Stage IIa ER/PR positive HER-2 negative adenocarcinoma of the upper inner quadrant of right breast.  INTERVAL HISTORY: Patient returns to clinic today for routine 6 month evaluation. She is tolerating letrozole well without significant side effects. She has no neurologic complaints.  She denies any recent fevers.  She has a good appetite.  She denies any chest pain or shortness of breath.  She denies any nausea, vomiting, constipation, or diarrhea.  She has no urinary complaints.  Patient offers no further specific complaints today.  REVIEW OF SYSTEMS:   Review of Systems  Constitutional: Negative for fever, malaise/fatigue and weight loss.  HENT: Negative for sore throat.   Respiratory: Negative.  Negative for cough and shortness of breath.   Cardiovascular: Negative.  Negative for chest pain.  Gastrointestinal: Negative.   Genitourinary: Negative.   Musculoskeletal: Negative.   Neurological: Negative.  Negative for weakness.    As per HPI. Otherwise, a complete review of systems is negatve.  PAST MEDICAL HISTORY: Past Medical History:  Diagnosis Date  . Asthma   . B12 deficiency   . Breast cancer ([place]) 2014   with radiation  . Cataract   . Diabetes mellitus without complication ([place])   . Family history of iron deficiency anemia   . Hypertension   . Mental retardation   . Mixed hyperlipidemia   . Schizophrenia ([place])     PAST SURGICAL HISTORY: Past Surgical History:  Procedure Laterality Date  . ABDOMINAL HYSTERECTOMY    . BREAST EXCISIONAL BIOPSY Right 2014  . MASTECTOMY, PARTIAL      FAMILY HISTORY:  Diabetes and hypertension.     ADVANCED DIRECTIVES:    HEALTH MAINTENANCE: Social History  Substance Use Topics  . Smoking  status: Never Smoker  . Smokeless tobacco: Never Used  . Alcohol use No     Colonoscopy:  PAP:  Bone density:  Lipid panel:  No Known Allergies  Current Outpatient Prescriptions  Medication Sig Dispense Refill  . Acetaminophen 500 MG coapsule Take by mouth.    Marland Kitchen aspirin EC 81 MG tablet Take by mouth.    Marland Kitchen azelastine (ASTELIN) 0.1 % nasal spray Place into the nose.    . cyanocobalamin (,VITAMIN B-12,) 1000 MCG/ML injection Inject into the muscle.    . docusate sodium (STOOL SOFTENER) 100 MG capsule Take by mouth.    . enalapril (VASOTEC) 10 MG tablet     . fluPHENAZine (PROLIXIN) 1 MG tablet     . fluticasone (FLONASE) 50 MCG/ACT nasal spray     . furosemide (LASIX) 20 MG tablet     . glimepiride (AMARYL) 4 MG tablet     . ketorolac (ACULAR) 0.5 % ophthalmic solution     . lansoprazole (PREVACID) 30 MG capsule Take 1 capsule (30 mg total) by mouth daily at 12 noon. 30 capsule 2  . letrozole (FEMARA) 2.5 MG tablet TAKE ONE TABLET BY MOUTH EVERY DAY 90 tablet 3  . metFORMIN (GLUCOPHAGE) 1000 MG tablet     . moxifloxacin (VIGAMOX) 0.5 % ophthalmic solution     . Multiple Vitamin (MULTI-VITAMINS) TABS Take by mouth.    . prednisoLONE acetate (PRED FORTE) 1 % ophthalmic suspension     . TRADJENTA 5 MG TABS tablet      No current facility-administered medications  for this visit.     OBJECTIVE: There were no vitals filed for this visit.   There is no height or weight on file to calculate BMI.    ECOG FS:0 - Asymptomatic  General: Well-developed, well-nourished, no acute distress. Eyes: anicteric sclera. Breasts: Exam deferred today. Lungs: Clear to auscultation bilaterally. Heart: Regular rate and rhythm. No rubs, murmurs, or gallops. Abdomen: Soft, nontender, nondistended. No organomegaly noted, normoactive bowel sounds. Musculoskeletal: No edema, cyanosis, or clubbing. Neuro: Alert, answering all questions appropriately. Cranial nerves grossly intact. Skin: No rashes or  petechiae noted. Psych: Normal affect.    LAB RESULTS:  Lab Results  Component Value Date   NA 138 07/15/2013   K 4.0 07/15/2013   CL 105 07/15/2013   CO2 28 07/15/2013   GLUCOSE 130 (H) 07/15/2013   BUN 15 07/15/2013   CREATININE 0.85 07/15/2013   CALCIUM 9.6 07/15/2013   PROT 7.9 07/15/2013   ALBUMIN 3.7 07/15/2013   AST 29 07/15/2013   ALT 37 07/15/2013   ALKPHOS 93 07/15/2013   BILITOT 0.3 07/15/2013   GFRNONAA >60 07/15/2013   GFRAA >60 07/15/2013    Lab Results  Component Value Date   WBC 6.7 11/11/2013   NEUTROABS 4.9 11/11/2013   HGB 12.4 11/11/2013   HCT 38.9 11/11/2013   MCV 80 11/11/2013   PLT 297 11/11/2013     STUDIES: Mm Diag Breast Tomo Bilateral  Result Date: 05/24/2016 CLINICAL DATA:  History of right breast cancer in 2014 status post lumpectomy and radiation therapy. EXAM: 2D DIGITAL DIAGNOSTIC BILATERAL MAMMOGRAM WITH CAD AND ADJUNCT TOMO COMPARISON:  Previous exam(s). ACR Breast Density Category b: There are scattered areas of fibroglandular density. FINDINGS: There are stable postsurgical and postradiation changes in the right breast. There are no new dominant masses, suspicious calcifications or secondary signs of malignancy within either breast. Mammographic images were processed with CAD. IMPRESSION: No evidence of malignancy. Stable postsurgical changes in the right breast. RECOMMENDATION: Bilateral diagnostic mammogram in 1 year. Patient also complained of intermittent pain within the outer right breast. Benign causes of breast pain were discussed with the patient. Patient was encouraged to follow-up with referring physician if pain became localized and persistent or if a palpable lump/mass developed. I have discussed the findings and recommendations with the patient. Results were also provided in writing at the conclusion of the visit. If applicable, a reminder letter will be sent to the patient regarding the next appointment. BI-RADS CATEGORY  2:  Benign. Electronically Signed   By: Franki Cabot M.D.   On: 05/24/2016 10:38   ASSESSMENT: Stage IIa ER/PR positive HER-2 negative adenocarcinoma of the upper inner quadrant of right breast.   PLAN:     1.  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  Given patient's decreased mental capacity and likely inability to consent or understand the treatment plan, she did not receive adjuvant chemotherapy. Her most recent mammogram on May 24, 2016 was reported as BI-RADS 2. Repeat in July 2018.  She also had a bone mineral density on January 15, 2014 that was reported as normal, repeat in the next 1-2 weeks.  Continue with letrozole which she will take daily for 5 years completing in March of 2020. Return to clinic in 6 months with repeat laboratory work and further evaluation.  Patient expressed understanding and was in agreement with this plan. She also understands that She  can call clinic at any time with any questions, concerns, or complaints.    Lloyd Huger, MD   05/30/2016 11:16 PM

## 2016-05-31 ENCOUNTER — Other Ambulatory Visit: Payer: Self-pay

## 2016-05-31 ENCOUNTER — Inpatient Hospital Stay (HOSPITAL_BASED_OUTPATIENT_CLINIC_OR_DEPARTMENT_OTHER): Payer: Medicare Other | Admitting: Oncology

## 2016-05-31 ENCOUNTER — Inpatient Hospital Stay: Payer: Medicare Other | Attending: Oncology

## 2016-05-31 VITALS — BP 144/84 | HR 98 | Temp 96.9°F | Resp 17 | Wt 280.2 lb

## 2016-05-31 DIAGNOSIS — Z170 Estrogen receptor positive status [ER+]: Secondary | ICD-10-CM

## 2016-05-31 DIAGNOSIS — F209 Schizophrenia, unspecified: Secondary | ICD-10-CM | POA: Insufficient documentation

## 2016-05-31 DIAGNOSIS — C50211 Malignant neoplasm of upper-inner quadrant of right female breast: Secondary | ICD-10-CM

## 2016-05-31 DIAGNOSIS — Z923 Personal history of irradiation: Secondary | ICD-10-CM | POA: Insufficient documentation

## 2016-05-31 DIAGNOSIS — Z79899 Other long term (current) drug therapy: Secondary | ICD-10-CM | POA: Insufficient documentation

## 2016-05-31 DIAGNOSIS — I10 Essential (primary) hypertension: Secondary | ICD-10-CM | POA: Insufficient documentation

## 2016-05-31 DIAGNOSIS — Z79811 Long term (current) use of aromatase inhibitors: Secondary | ICD-10-CM | POA: Insufficient documentation

## 2016-05-31 DIAGNOSIS — C50911 Malignant neoplasm of unspecified site of right female breast: Secondary | ICD-10-CM

## 2016-05-31 DIAGNOSIS — E785 Hyperlipidemia, unspecified: Secondary | ICD-10-CM | POA: Diagnosis not present

## 2016-05-31 DIAGNOSIS — E119 Type 2 diabetes mellitus without complications: Secondary | ICD-10-CM | POA: Diagnosis not present

## 2016-05-31 DIAGNOSIS — F79 Unspecified intellectual disabilities: Secondary | ICD-10-CM | POA: Insufficient documentation

## 2016-05-31 NOTE — Progress Notes (Signed)
No major changes since last visit.  Pt reports pain under right armpit.

## 2016-06-01 LAB — CANCER ANTIGEN 27.29: CA 27.29: 22.8 U/mL (ref 0.0–38.6)

## 2016-06-26 ENCOUNTER — Ambulatory Visit: Payer: Medicare Other | Attending: Oncology

## 2016-06-26 ENCOUNTER — Inpatient Hospital Stay: Payer: Medicare Other

## 2016-07-25 ENCOUNTER — Ambulatory Visit
Admission: RE | Admit: 2016-07-25 | Discharge: 2016-07-25 | Disposition: A | Discharge status: Home / Self Care | Payer: Medicare Other | Source: Ambulatory Visit | Attending: Oncology | Admitting: Oncology

## 2016-07-25 DIAGNOSIS — Z780 Asymptomatic menopausal state: Secondary | ICD-10-CM | POA: Diagnosis not present

## 2016-07-25 DIAGNOSIS — C50211 Malignant neoplasm of upper-inner quadrant of right female breast: Secondary | ICD-10-CM | POA: Diagnosis not present

## 2016-12-01 ENCOUNTER — Other Ambulatory Visit: Payer: Self-pay | Admitting: Oncology

## 2016-12-05 NOTE — Progress Notes (Signed)
[facility]  [phone] [phone]  ID: Lynne Leader OB: [birth year]  MR#: [id number]  LXB#:[id number]  Patient Care Team: Lorenza Evangelist, MD as PCP - General (Endocrinology)  CHIEF COMPLAINT: Stage IIa ER/PR positive HER-2 negative adenocarcinoma of the upper inner quadrant of right breast.  INTERVAL HISTORY: Patient returns to clinic today for routine 6 month evaluation. She continues to tolerate letrozole well without significant side effects. She has no neurologic complaints.  She denies any recent fevers or illnesses.  She has a good appetite.  She denies any chest pain or shortness of breath.  She denies any nausea, vomiting, constipation, or diarrhea.  She has no urinary complaints.  Patient offers no specific complaints today.  REVIEW OF SYSTEMS:   Review of Systems  Constitutional: Negative for fever, malaise/fatigue and weight loss.  HENT: Negative for sore throat.   Respiratory: Negative.  Negative for cough and shortness of breath.   Cardiovascular: Negative.  Negative for chest pain and leg swelling.  Gastrointestinal: Negative.  Negative for abdominal pain.  Genitourinary: Negative.   Musculoskeletal: Negative.   Neurological: Negative.  Negative for weakness.  Psychiatric/Behavioral: Negative.  The patient is not nervous/anxious.     As per HPI. Otherwise, a complete review of systems is negative.  PAST MEDICAL HISTORY: Past Medical History:  Diagnosis Date  . Asthma   . B12 deficiency   . Breast cancer ([place]) 2014   with radiation  . Cataract   . Diabetes mellitus without complication ([place])   . Family history of iron deficiency anemia   . Hypertension   . Mental retardation   . Mixed hyperlipidemia   . Schizophrenia ([place])     PAST SURGICAL HISTORY: Past Surgical History:  Procedure Laterality Date  . ABDOMINAL HYSTERECTOMY    . BREAST EXCISIONAL BIOPSY Right 2014  . MASTECTOMY, PARTIAL      FAMILY HISTORY:   Diabetes and hypertension.     ADVANCED DIRECTIVES:    HEALTH MAINTENANCE: Social History  Substance Use Topics  . Smoking status: Never Smoker  . Smokeless tobacco: Never Used  . Alcohol use No     Colonoscopy:  PAP:  Bone density:  Lipid panel:  No Known Allergies  Current Outpatient Prescriptions  Medication Sig Dispense Refill  . Acetaminophen 500 MG coapsule Take by mouth.    Marland Kitchen azelastine (ASTELIN) 0.1 % nasal spray Place into the nose.    . cyanocobalamin (,VITAMIN B-12,) 1000 MCG/ML injection Inject into the muscle.    . enalapril (VASOTEC) 10 MG tablet TAKE ONE TABLET BY MOUTH EVERY MORNING.    . fluPHENAZine (PROLIXIN) 1 MG tablet     . fluticasone (FLONASE) 50 MCG/ACT nasal spray     . furosemide (LASIX) 20 MG tablet     . glimepiride (AMARYL) 4 MG tablet Take by mouth.    Marland Kitchen ketorolac (ACULAR) 0.5 % ophthalmic solution     . letrozole (FEMARA) 2.5 MG tablet TAKE ONE TABLET BY MOUTH EVERY DAY 90 tablet 0  . linagliptin (TRADJENTA) 5 MG TABS tablet TAKE 1 TABLET BY MOUTH EVERY DAY.    . metFORMIN (GLUCOPHAGE) 1000 MG tablet     . moxifloxacin (VIGAMOX) 0.5 % ophthalmic solution     . docusate sodium (STOOL SOFTENER) 100 MG capsule Take by mouth.    . lansoprazole (PREVACID) 30 MG capsule Take 1 capsule (30 mg total) by mouth daily at 12 noon. (Patient not taking: Reported on 12/06/2016) 30 capsule 2  No current facility-administered medications for this visit.     OBJECTIVE: Vitals:   12/06/16 1033  BP: (!) 145/86  Pulse: (!) 106  Resp: 18  Temp: 98.3 F (36.8 C)     There is no height or weight on file to calculate BMI.    ECOG FS:0 - Asymptomatic  General: Well-developed, well-nourished, no acute distress. Eyes: anicteric sclera. Breasts: Bilateral breast and axilla without evidence of recurrence. Lungs: Clear to auscultation bilaterally. Heart: Regular rate and rhythm. No rubs, murmurs, or gallops. Abdomen: Soft, nontender, nondistended. No  organomegaly noted, normoactive bowel sounds. Musculoskeletal: No edema, cyanosis, or clubbing. Neuro: Alert, answering all questions appropriately. Cranial nerves grossly intact. Skin: No rashes or petechiae noted. Psych: Normal affect.    LAB RESULTS:  Lab Results  Component Value Date   NA 138 07/15/2013   K 4.0 07/15/2013   CL 105 07/15/2013   CO2 28 07/15/2013   GLUCOSE 130 (H) 07/15/2013   BUN 15 07/15/2013   CREATININE 0.85 07/15/2013   CALCIUM 9.6 07/15/2013   PROT 7.9 07/15/2013   ALBUMIN 3.7 07/15/2013   AST 29 07/15/2013   ALT 37 07/15/2013   ALKPHOS 93 07/15/2013   BILITOT 0.3 07/15/2013   GFRNONAA >60 07/15/2013   GFRAA >60 07/15/2013    Lab Results  Component Value Date   WBC 6.7 11/11/2013   NEUTROABS 4.9 11/11/2013   HGB 12.4 11/11/2013   HCT 38.9 11/11/2013   MCV 80 11/11/2013   PLT 297 11/11/2013   Lab Results  Component Value Date   LABCA2 24.0 12/06/2016     STUDIES: No results found.  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   PLAN:     1.  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 Given patient's decreased mental capacity and likely inability to consent or understand the treatment plan, she did not receive adjuvant chemotherapy. Her most recent mammogram on May 24, 2016 was reported as BI-RADS 2. Repeat in July 2018.  She also had a bone mineral density on July 25, 2016 that was reported as normal with a T score of -0.6. Repeat bone density in September 2019.  Continue with letrozole which she will take daily for 5 years completing in March of 2020. Return to clinic in 6 months with repeat laboratory work and further evaluation.  Patient expressed understanding and was in agreement with this plan. She also understands that She can call  clinic at any time with any questions, concerns, or complaints.    Lloyd Huger, MD   12/10/2016 8:55 AM

## 2016-12-06 ENCOUNTER — Inpatient Hospital Stay (HOSPITAL_BASED_OUTPATIENT_CLINIC_OR_DEPARTMENT_OTHER): Payer: Medicare Other | Admitting: Oncology

## 2016-12-06 ENCOUNTER — Inpatient Hospital Stay: Payer: Medicare Other | Attending: Oncology

## 2016-12-06 VITALS — BP 145/86 | HR 106 | Temp 98.3°F | Resp 18 | Wt 288.4 lb

## 2016-12-06 DIAGNOSIS — E538 Deficiency of other specified B group vitamins: Secondary | ICD-10-CM | POA: Diagnosis not present

## 2016-12-06 DIAGNOSIS — C50211 Malignant neoplasm of upper-inner quadrant of right female breast: Secondary | ICD-10-CM

## 2016-12-06 DIAGNOSIS — J45909 Unspecified asthma, uncomplicated: Secondary | ICD-10-CM | POA: Diagnosis not present

## 2016-12-06 DIAGNOSIS — E782 Mixed hyperlipidemia: Secondary | ICD-10-CM | POA: Diagnosis not present

## 2016-12-06 DIAGNOSIS — F79 Unspecified intellectual disabilities: Secondary | ICD-10-CM | POA: Diagnosis not present

## 2016-12-06 DIAGNOSIS — I10 Essential (primary) hypertension: Secondary | ICD-10-CM | POA: Insufficient documentation

## 2016-12-06 DIAGNOSIS — E119 Type 2 diabetes mellitus without complications: Secondary | ICD-10-CM | POA: Insufficient documentation

## 2016-12-06 DIAGNOSIS — Z170 Estrogen receptor positive status [ER+]: Secondary | ICD-10-CM | POA: Insufficient documentation

## 2016-12-06 DIAGNOSIS — Z9011 Acquired absence of right breast and nipple: Secondary | ICD-10-CM

## 2016-12-06 DIAGNOSIS — Z79811 Long term (current) use of aromatase inhibitors: Secondary | ICD-10-CM | POA: Diagnosis not present

## 2016-12-06 DIAGNOSIS — Z7984 Long term (current) use of oral hypoglycemic drugs: Secondary | ICD-10-CM | POA: Diagnosis not present

## 2016-12-06 DIAGNOSIS — F209 Schizophrenia, unspecified: Secondary | ICD-10-CM | POA: Diagnosis not present

## 2016-12-06 NOTE — Progress Notes (Signed)
Patient is here for follow up. She mentions some tenderness in right breast

## 2016-12-07 LAB — CANCER ANTIGEN 27.29: CA 27.29: 24 U/mL (ref 0.0–38.6)

## 2016-12-11 ENCOUNTER — Ambulatory Visit: Admit: 2016-12-11 | Payer: Medicare Other | Admitting: Unknown Physician Specialty

## 2016-12-11 SURGERY — COLONOSCOPY WITH PROPOFOL
Anesthesia: General

## 2017-01-18 ENCOUNTER — Ambulatory Visit
Admission: RE | Admit: 2017-01-18 | Discharge: 2017-01-18 | Disposition: A | Discharge status: Home / Self Care | Payer: Medicare Other | Source: Ambulatory Visit | Attending: Radiation Oncology | Admitting: Radiation Oncology

## 2017-01-18 ENCOUNTER — Encounter: Payer: Self-pay | Admitting: Radiation Oncology

## 2017-01-18 VITALS — Temp 97.8°F | Wt 283.3 lb

## 2017-01-18 DIAGNOSIS — Z923 Personal history of irradiation: Secondary | ICD-10-CM | POA: Insufficient documentation

## 2017-01-18 DIAGNOSIS — C50211 Malignant neoplasm of upper-inner quadrant of right female breast: Secondary | ICD-10-CM | POA: Diagnosis present

## 2017-01-18 DIAGNOSIS — E669 Obesity, unspecified: Secondary | ICD-10-CM | POA: Diagnosis not present

## 2017-01-18 DIAGNOSIS — Z79811 Long term (current) use of aromatase inhibitors: Secondary | ICD-10-CM | POA: Insufficient documentation

## 2017-01-18 DIAGNOSIS — Z170 Estrogen receptor positive status [ER+]: Secondary | ICD-10-CM | POA: Diagnosis not present

## 2017-01-18 NOTE — Progress Notes (Signed)
Radiation Oncology Follow up Note  Name: Deborah Castillo   Date:   01/18/2017 MRN:  [id number] DOB: 1961/07/08    This 56 y.o. 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  REFERRING PROVIDER: Lorenza Evangelist, MD  HPI: Patient is a 56 year old female morbidly obese with cognitive impairment who is now 3 years out from whole breast radiation to her right breast for stage IIa invasive mammary carcinoma low-grade ER/PR positive. She seen today in routine follow-up is doing well she still has some what she describes as occasional pain in the lateral portion of her right breast. She is currently on letrozole tolerating that well without side effect.. Her mammograms last July were BI-RADS 2 benign.  COMPLICATIONS OF TREATMENT: none  FOLLOW UP COMPLIANCE: keeps appointments   PHYSICAL EXAM:  Temp 97.8 F (36.6 C)   Wt 283 lb 4.7 oz (128.5 kg)  No well-developed morbidly obese female in NAD. Right breast is somewhat fibrotic with some slight edema present consistent with radiation change. Patient initially was extremely pendulous. No dominant mass or nodularity is noted in either breast in 2 positions examined. No axillary or supraclavicular adenopathy is identified. Well-developed well-nourished patient in NAD. HEENT reveals PERLA, EOMI, discs not visualized.  Oral cavity is clear. No oral mucosal lesions are identified. Neck is clear without evidence of cervical or supraclavicular adenopathy. Lungs are clear to A&P. Cardiac examination is essentially unremarkable with regular rate and rhythm without murmur rub or thrill. Abdomen is benign with no organomegaly or masses noted. Motor sensory and DTR levels are equal and symmetric in the upper and lower extremities. Cranial nerves II through XII are grossly intact. Proprioception is intact. No peripheral adenopathy or edema is  identified. No motor or sensory levels are noted. Crude visual fields are within normal range.  RADIOLOGY RESULTS: Mammograms are reviewed and compatible with the above-stated findings  PLAN: Present time she continues to do well with no evidence of disease. She continues follow-up mammograms next July. She continues on letrozole. I've assured her the pain she is experiencing is scar tissue in the breast which may persist over time. I've asked her to increase some of her normal activity including walking. I've asked to see her back in 1 year and will discontinue follow-up care after that time.. Patient in mother noted call at anytime with any questions or concerns. I would like to take this opportunity to thank you for allowing me to participate in the care of your patient.Armstead Peaks., MD

## 2017-01-31 ENCOUNTER — Ambulatory Visit: Payer: Medicare Other | Admitting: Radiation Oncology

## 2017-02-21 DIAGNOSIS — Z6841 Body Mass Index (BMI) 40.0 and over, adult: Secondary | ICD-10-CM

## 2017-05-28 ENCOUNTER — Ambulatory Visit
Admission: RE | Admit: 2017-05-28 | Discharge: 2017-05-28 | Disposition: A | Discharge status: Home / Self Care | Payer: Medicare Other | Source: Ambulatory Visit | Attending: Oncology | Admitting: Oncology

## 2017-05-28 DIAGNOSIS — Z170 Estrogen receptor positive status [ER+]: Principal | ICD-10-CM

## 2017-05-28 DIAGNOSIS — C50211 Malignant neoplasm of upper-inner quadrant of right female breast: Secondary | ICD-10-CM

## 2017-05-28 DIAGNOSIS — Z9889 Other specified postprocedural states: Secondary | ICD-10-CM | POA: Diagnosis not present

## 2017-05-28 DIAGNOSIS — Z853 Personal history of malignant neoplasm of breast: Secondary | ICD-10-CM | POA: Diagnosis present

## 2017-06-06 ENCOUNTER — Other Ambulatory Visit: Payer: Self-pay | Admitting: Oncology

## 2017-06-06 NOTE — Progress Notes (Deleted)
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  INTERVAL HISTORY: Patient returns to clinic today for routine 6 month evaluation. She continues to tolerate letrozole well without significant side effects. She has no neurologic complaints.  She denies any recent fevers or illnesses.  She has a good appetite.  She denies any chest pain or shortness of breath.  She denies any nausea, vomiting, constipation, or diarrhea.  She has no urinary complaints.  Patient offers no specific complaints today.  REVIEW OF SYSTEMS:   Review of Systems  Constitutional: Negative for fever, malaise/fatigue and weight loss.  HENT: Negative for sore throat.   Respiratory: Negative.  Negative for cough and shortness of breath.   Cardiovascular: Negative.  Negative for chest pain and leg swelling.  Gastrointestinal: Negative.  Negative for abdominal pain.  Genitourinary: Negative.   Musculoskeletal: Negative.   Neurological: Negative.  Negative for weakness.  Psychiatric/Behavioral: Negative.  The patient is not nervous/anxious.     As per HPI. Otherwise, a complete review of systems is negative.  PAST MEDICAL HISTORY: Past Medical History:  Diagnosis Date  . Asthma   . B12 deficiency   . Breast cancer ([place]) 2014   with radiation  . Cataract   . Diabetes mellitus without complication ([name])   . Family history of iron deficiency anemia   . Hypertension   . Mental retardation   . Mixed hyperlipidemia   . Schizophrenia ([place])     PAST SURGICAL HISTORY: Past Surgical History:  Procedure Laterality Date  . ABDOMINAL HYSTERECTOMY    . BREAST EXCISIONAL BIOPSY Right 2014  . MASTECTOMY, PARTIAL      FAMILY HISTORY:   Diabetes and hypertension.     ADVANCED DIRECTIVES:    HEALTH MAINTENANCE: Social History  Substance Use Topics  . Smoking status: Never Smoker  . Smokeless tobacco: Never Used  . Alcohol use No     Colonoscopy:  PAP:  Bone density:  Lipid panel:  No Known Allergies  Current Outpatient Prescriptions  Medication Sig Dispense Refill  . Acetaminophen 500 MG coapsule Take by mouth.    Marland Kitchen azelastine (ASTELIN) 0.1 % nasal spray Place into the nose.    . canagliflozin (INVOKANA) 100 MG TABS tablet Take by mouth.    . cyanocobalamin (,VITAMIN B-12,) 1000 MCG/ML injection Inject into the muscle.    . docusate sodium (STOOL SOFTENER) 100 MG capsule Take by mouth.    . enalapril (VASOTEC) 10 MG tablet TAKE ONE TABLET BY MOUTH EVERY MORNING.    . fluPHENAZine (PROLIXIN) 1 MG tablet     . fluticasone (FLONASE) 50 MCG/ACT nasal spray     . furosemide (LASIX) 20 MG tablet     . glimepiride (AMARYL) 4 MG tablet Take by mouth.    Marland Kitchen ketorolac (ACULAR) 0.5 % ophthalmic solution     . lansoprazole (PREVACID) 30 MG capsule Take 1 capsule (30 mg total) by mouth daily at 12 noon. (Patient not taking: Reported on 12/06/2016) 30 capsule 2  . letrozole (FEMARA) 2.5 MG tablet TAKE ONE TABLET BY MOUTH EVERY DAY 90 tablet 3  . linagliptin (TRADJENTA) 5 MG TABS tablet TAKE 1 TABLET BY MOUTH EVERY DAY.    . metFORMIN (GLUCOPHAGE) 1000 MG tablet     .  moxifloxacin (VIGAMOX) 0.5 % ophthalmic solution      No current facility-administered medications for this visit.     OBJECTIVE: There were no vitals filed for this visit.   There is no height or weight on file to calculate BMI.    ECOG FS:0 - Asymptomatic  General: Well-developed, well-nourished, no acute distress. Eyes: anicteric sclera. Breasts: Bilateral breast and axilla without evidence of recurrence. Lungs: Clear to auscultation bilaterally. Heart: Regular rate and rhythm. No rubs, murmurs, or gallops. Abdomen: Soft, nontender, nondistended. No  organomegaly noted, normoactive bowel sounds. Musculoskeletal: No edema, cyanosis, or clubbing. Neuro: Alert, answering all questions appropriately. Cranial nerves grossly intact. Skin: No rashes or petechiae noted. Psych: Normal affect.    LAB RESULTS:  Lab Results  Component Value Date   NA 138 07/15/2013   K 4.0 07/15/2013   CL 105 07/15/2013   CO2 28 07/15/2013   GLUCOSE 130 (H) 07/15/2013   BUN 15 07/15/2013   CREATININE 0.85 07/15/2013   CALCIUM 9.6 07/15/2013   PROT 7.9 07/15/2013   ALBUMIN 3.7 07/15/2013   AST 29 07/15/2013   ALT 37 07/15/2013   ALKPHOS 93 07/15/2013   BILITOT 0.3 07/15/2013   GFRNONAA >60 07/15/2013   GFRAA >60 07/15/2013    Lab Results  Component Value Date   WBC 6.7 11/11/2013   NEUTROABS 4.9 11/11/2013   HGB 12.4 11/11/2013   HCT 38.9 11/11/2013   MCV 80 11/11/2013   PLT 297 11/11/2013   Lab Results  Component Value Date   LABCA2 24.0 12/06/2016     STUDIES: Mm Diag Breast Tomo Bilateral  Result Date: 05/28/2017 CLINICAL DATA:  56 year old female presenting for routine annual evaluation status post right breast lumpectomy in 2014. EXAM: 2D DIGITAL DIAGNOSTIC BILATERAL MAMMOGRAM WITH CAD AND ADJUNCT TOMO COMPARISON:  Previous exam(s). ACR Breast Density Category b: There are scattered areas of fibroglandular density. FINDINGS: The right breast lumpectomy site is stable. No suspicious calcifications, masses or areas of distortion are seen in the bilateral breasts. Mammographic images were processed with CAD. IMPRESSION: Stable right breast lumpectomy site. No mammographic evidence of malignancy in the bilateral breasts. RECOMMENDATION: Diagnostic mammogram is suggested in 1 year. (Code:DM-B-01Y) I have discussed the findings and recommendations with the patient. Results were also provided in writing at the conclusion of the visit. If applicable, a reminder letter will be sent to the patient regarding the next appointment. BI-RADS CATEGORY  2:  Benign. Electronically Signed   By: Ammie Ferrier M.D.   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   PLAN:     1.  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 Given patient's decreased mental capacity and likely inability to consent or understand the treatment plan, she did not receive adjuvant chemotherapy. Her most recent mammogram on May 24, 2016 was reported as BI-RADS 2. Repeat in July 2018.  She also had a bone mineral density on July 25, 2016 that was reported as normal with a T score of -0.6. Repeat bone density in September 2019.  Continue with letrozole which she will take daily for 5 years completing in March of 2020. Return to clinic in 6 months with repeat laboratory work and further evaluation.  Patient expressed understanding and was in agreement with this plan. She also understands that She can call  clinic at any time with any questions, concerns, or complaints.    Lloyd Huger, MD   06/06/2017 9:23 PM

## 2017-06-07 ENCOUNTER — Inpatient Hospital Stay: Payer: Medicare Other | Admitting: Oncology

## 2017-07-24 ENCOUNTER — Other Ambulatory Visit: Payer: Self-pay | Admitting: *Deleted

## 2017-07-24 DIAGNOSIS — C50919 Malignant neoplasm of unspecified site of unspecified female breast: Secondary | ICD-10-CM

## 2017-07-24 NOTE — Progress Notes (Signed)
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  INTERVAL HISTORY: Patient returns to clinic today for routine 6 month evaluation. She continues to tolerate letrozole well without significant side effects. She has no neurologic complaints.  She denies any recent fevers or illnesses.  She has a good appetite.  She denies any chest pain or shortness of breath.  She denies any nausea, vomiting, constipation, or diarrhea.  She has no urinary complaints.  Patient offers no specific complaints today.  REVIEW OF SYSTEMS:   Review of Systems  Constitutional: Negative for fever, malaise/fatigue and weight loss.  HENT: Negative for sore throat.   Respiratory: Negative.  Negative for cough and shortness of breath.   Cardiovascular: Negative.  Negative for chest pain and leg swelling.  Gastrointestinal: Negative.  Negative for abdominal pain.  Genitourinary: Negative.   Musculoskeletal: Negative.   Neurological: Negative.  Negative for weakness.  Psychiatric/Behavioral: Negative.  The patient is not nervous/anxious.     As per HPI. Otherwise, a complete review of systems is negative.  PAST MEDICAL HISTORY: Past Medical History:  Diagnosis Date  . Asthma   . B12 deficiency   . Breast cancer ([place]) 2014   with radiation  . Cataract   . Diabetes mellitus without complication ([place])   . Family history of iron deficiency anemia   . Hypertension   . Mental retardation   . Mixed hyperlipidemia   . Schizophrenia ([place])     PAST SURGICAL HISTORY: Past Surgical History:  Procedure Laterality Date  . ABDOMINAL HYSTERECTOMY    . BREAST EXCISIONAL BIOPSY Right 2014  . MASTECTOMY, PARTIAL      FAMILY HISTORY:   Diabetes and hypertension.     ADVANCED DIRECTIVES:    HEALTH MAINTENANCE: Social History  Substance Use Topics  . Smoking status: Never Smoker  . Smokeless tobacco: Never Used  . Alcohol use No     Colonoscopy:  PAP:  Bone density:  Lipid panel:  No Known Allergies  Current Outpatient Prescriptions  Medication Sig Dispense Refill  . Acetaminophen 500 MG coapsule Take by mouth.    Marland Kitchen azelastine (ASTELIN) 0.1 % nasal spray Place into the nose.    . canagliflozin (INVOKANA) 100 MG TABS tablet Take by mouth.    . docusate sodium (STOOL SOFTENER) 100 MG capsule Take by mouth.    . enalapril (VASOTEC) 10 MG tablet TAKE ONE TABLET BY MOUTH EVERY MORNING.    . fluPHENAZine (PROLIXIN) 1 MG tablet     . fluticasone (FLONASE) 50 MCG/ACT nasal spray     . furosemide (LASIX) 20 MG tablet     . glimepiride (AMARYL) 4 MG tablet Take by mouth.    Marland Kitchen ketorolac (ACULAR) 0.5 % ophthalmic solution     . lansoprazole (PREVACID) 30 MG capsule Take 1 capsule (30 mg total) by mouth daily at 12 noon. 30 capsule 2  . letrozole (FEMARA) 2.5 MG tablet TAKE ONE TABLET BY MOUTH EVERY DAY 90 tablet 3  . linagliptin (TRADJENTA) 5 MG TABS tablet TAKE 1 TABLET BY MOUTH EVERY DAY.    . metFORMIN (GLUCOPHAGE) 1000 MG tablet     . moxifloxacin (VIGAMOX) 0.5 % ophthalmic solution     . ranitidine (ZANTAC) 150 MG tablet     .  VENTOLIN HFA 108 (90 Base) MCG/ACT inhaler      No current facility-administered medications for this visit.     OBJECTIVE: Vitals:   07/25/17 1505  BP: 132/87  Pulse: 90     There is no height or weight on file to calculate BMI.    ECOG FS:0 - Asymptomatic  General: Well-developed, well-nourished, no acute distress. Eyes: anicteric sclera. Breasts: Bilateral breast and axilla without evidence of recurrence. Exam deferred today. Lungs: Clear to auscultation bilaterally. Heart: Regular rate and rhythm. No rubs, murmurs, or gallops. Abdomen: Soft, nontender, nondistended. No  organomegaly noted, normoactive bowel sounds. Musculoskeletal: No edema, cyanosis, or clubbing. Neuro: Alert, answering all questions appropriately. Cranial nerves grossly intact. Skin: No rashes or petechiae noted. Psych: Normal affect.    LAB RESULTS:  Lab Results  Component Value Date   NA 138 07/15/2013   K 4.0 07/15/2013   CL 105 07/15/2013   CO2 28 07/15/2013   GLUCOSE 130 (H) 07/15/2013   BUN 15 07/15/2013   CREATININE 0.85 07/15/2013   CALCIUM 9.6 07/15/2013   PROT 7.9 07/15/2013   ALBUMIN 3.7 07/15/2013   AST 29 07/15/2013   ALT 37 07/15/2013   ALKPHOS 93 07/15/2013   BILITOT 0.3 07/15/2013   GFRNONAA >60 07/15/2013   GFRAA >60 07/15/2013    Lab Results  Component Value Date   WBC 6.7 11/11/2013   NEUTROABS 4.9 11/11/2013   HGB 12.4 11/11/2013   HCT 38.9 11/11/2013   MCV 80 11/11/2013   PLT 297 11/11/2013   Lab Results  Component Value Date   LABCA2 24.0 12/06/2016     STUDIES: No results found.  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   PLAN:     1.  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 Given patient's decreased mental capacity and likely inability to consent or understand the treatment plan, she did not receive adjuvant chemotherapy. Her most recent mammogram on May 28, 2017 was reported as BI-RADS 2. Repeat in July 2019.  She also had a bone mineral density on July 25, 2016 that was reported as normal with a T score of -0.6. Repeat bone density in September 2019.  Continue with letrozole which she will take daily for 5 years completing in March of 2020. Return to clinic in 6 months with repeat laboratory work and further evaluation.  Approximately 20 minutes was spent in discussion of which greater than 50% was consultation.  Patient  expressed understanding and was in agreement with this plan. She also understands that She can call clinic at any time with any questions, concerns, or complaints.    Lloyd Huger, MD   07/29/2017 9:06 AM

## 2017-07-25 ENCOUNTER — Inpatient Hospital Stay: Payer: Medicare Other | Attending: Oncology | Admitting: Oncology

## 2017-07-25 ENCOUNTER — Encounter: Payer: Self-pay | Admitting: Oncology

## 2017-07-25 VITALS — BP 132/87 | HR 90 | Wt 280.6 lb

## 2017-07-25 DIAGNOSIS — F209 Schizophrenia, unspecified: Secondary | ICD-10-CM | POA: Insufficient documentation

## 2017-07-25 DIAGNOSIS — E118 Type 2 diabetes mellitus with unspecified complications: Secondary | ICD-10-CM | POA: Insufficient documentation

## 2017-07-25 DIAGNOSIS — Z79811 Long term (current) use of aromatase inhibitors: Secondary | ICD-10-CM

## 2017-07-25 DIAGNOSIS — J45909 Unspecified asthma, uncomplicated: Secondary | ICD-10-CM | POA: Insufficient documentation

## 2017-07-25 DIAGNOSIS — Z7984 Long term (current) use of oral hypoglycemic drugs: Secondary | ICD-10-CM | POA: Diagnosis not present

## 2017-07-25 DIAGNOSIS — F79 Unspecified intellectual disabilities: Secondary | ICD-10-CM | POA: Diagnosis not present

## 2017-07-25 DIAGNOSIS — Z79899 Other long term (current) drug therapy: Secondary | ICD-10-CM | POA: Insufficient documentation

## 2017-07-25 DIAGNOSIS — E538 Deficiency of other specified B group vitamins: Secondary | ICD-10-CM | POA: Diagnosis not present

## 2017-07-25 DIAGNOSIS — IMO0002 Reserved for concepts with insufficient information to code with codable children: Secondary | ICD-10-CM | POA: Insufficient documentation

## 2017-07-25 DIAGNOSIS — H269 Unspecified cataract: Secondary | ICD-10-CM | POA: Insufficient documentation

## 2017-07-25 DIAGNOSIS — Z923 Personal history of irradiation: Secondary | ICD-10-CM | POA: Diagnosis not present

## 2017-07-25 DIAGNOSIS — Z853 Personal history of malignant neoplasm of breast: Secondary | ICD-10-CM | POA: Insufficient documentation

## 2017-07-25 DIAGNOSIS — C50211 Malignant neoplasm of upper-inner quadrant of right female breast: Secondary | ICD-10-CM | POA: Diagnosis present

## 2017-07-25 DIAGNOSIS — E782 Mixed hyperlipidemia: Secondary | ICD-10-CM | POA: Diagnosis not present

## 2017-07-25 DIAGNOSIS — Z862 Personal history of diseases of the blood and blood-forming organs and certain disorders involving the immune mechanism: Secondary | ICD-10-CM | POA: Insufficient documentation

## 2017-07-25 DIAGNOSIS — I10 Essential (primary) hypertension: Secondary | ICD-10-CM | POA: Insufficient documentation

## 2017-07-25 DIAGNOSIS — E119 Type 2 diabetes mellitus without complications: Secondary | ICD-10-CM | POA: Insufficient documentation

## 2017-07-25 DIAGNOSIS — Z170 Estrogen receptor positive status [ER+]: Secondary | ICD-10-CM | POA: Insufficient documentation

## 2017-07-25 DIAGNOSIS — C50411 Malignant neoplasm of upper-outer quadrant of right female breast: Secondary | ICD-10-CM | POA: Insufficient documentation

## 2017-07-25 DIAGNOSIS — E1165 Type 2 diabetes mellitus with hyperglycemia: Secondary | ICD-10-CM | POA: Insufficient documentation

## 2017-07-25 DIAGNOSIS — B351 Tinea unguium: Secondary | ICD-10-CM | POA: Insufficient documentation

## 2017-11-21 ENCOUNTER — Other Ambulatory Visit: Payer: Self-pay | Admitting: Surgery

## 2017-11-21 DIAGNOSIS — Z853 Personal history of malignant neoplasm of breast: Secondary | ICD-10-CM

## 2017-11-28 ENCOUNTER — Inpatient Hospital Stay: Admission: RE | Admit: 2017-11-28 | Payer: Medicare Other | Source: Ambulatory Visit

## 2017-11-28 ENCOUNTER — Other Ambulatory Visit: Payer: Medicare Other

## 2018-01-23 ENCOUNTER — Ambulatory Visit: Payer: Medicare Other | Admitting: Oncology

## 2018-01-23 ENCOUNTER — Other Ambulatory Visit: Payer: Medicare Other

## 2018-01-31 ENCOUNTER — Inpatient Hospital Stay: Payer: Medicare Other | Attending: Oncology

## 2018-01-31 ENCOUNTER — Inpatient Hospital Stay (HOSPITAL_BASED_OUTPATIENT_CLINIC_OR_DEPARTMENT_OTHER): Payer: Medicare Other | Admitting: Oncology

## 2018-01-31 VITALS — BP 117/75 | HR 105 | Temp 97.5°F | Resp 18 | Wt 278.9 lb

## 2018-01-31 DIAGNOSIS — Z79899 Other long term (current) drug therapy: Secondary | ICD-10-CM | POA: Diagnosis not present

## 2018-01-31 DIAGNOSIS — E782 Mixed hyperlipidemia: Secondary | ICD-10-CM | POA: Insufficient documentation

## 2018-01-31 DIAGNOSIS — Z7984 Long term (current) use of oral hypoglycemic drugs: Secondary | ICD-10-CM | POA: Diagnosis not present

## 2018-01-31 DIAGNOSIS — F79 Unspecified intellectual disabilities: Secondary | ICD-10-CM | POA: Diagnosis not present

## 2018-01-31 DIAGNOSIS — Z9011 Acquired absence of right breast and nipple: Secondary | ICD-10-CM | POA: Diagnosis not present

## 2018-01-31 DIAGNOSIS — E538 Deficiency of other specified B group vitamins: Secondary | ICD-10-CM | POA: Insufficient documentation

## 2018-01-31 DIAGNOSIS — I10 Essential (primary) hypertension: Secondary | ICD-10-CM | POA: Diagnosis not present

## 2018-01-31 DIAGNOSIS — C50211 Malignant neoplasm of upper-inner quadrant of right female breast: Secondary | ICD-10-CM | POA: Diagnosis present

## 2018-01-31 DIAGNOSIS — E119 Type 2 diabetes mellitus without complications: Secondary | ICD-10-CM | POA: Insufficient documentation

## 2018-01-31 DIAGNOSIS — Z170 Estrogen receptor positive status [ER+]: Secondary | ICD-10-CM | POA: Diagnosis not present

## 2018-01-31 DIAGNOSIS — Z79811 Long term (current) use of aromatase inhibitors: Secondary | ICD-10-CM | POA: Insufficient documentation

## 2018-01-31 DIAGNOSIS — J45909 Unspecified asthma, uncomplicated: Secondary | ICD-10-CM | POA: Diagnosis not present

## 2018-01-31 DIAGNOSIS — F209 Schizophrenia, unspecified: Secondary | ICD-10-CM | POA: Diagnosis not present

## 2018-01-31 DIAGNOSIS — C50919 Malignant neoplasm of unspecified site of unspecified female breast: Secondary | ICD-10-CM

## 2018-01-31 NOTE — Progress Notes (Signed)
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  INTERVAL HISTORY: Patient returns to clinic today for routine 6 month evaluation. She continues to tolerate letrozole well without significant side effects.  She has an area of thickened skin on her right breast that is occasionally painful.  She otherwise feels well and is asymptomatic.  She has no neurologic complaints.  She denies any recent fevers or illnesses.  She has a good appetite.  She denies any chest pain or shortness of breath.  She denies any nausea, vomiting, constipation, or diarrhea.  She has no urinary complaints.  Patient offers no specific complaints today.  REVIEW OF SYSTEMS:   Review of Systems  Constitutional: Negative for fever, malaise/fatigue and weight loss.  HENT: Negative for sore throat.   Respiratory: Negative.  Negative for cough and shortness of breath.   Cardiovascular: Negative.  Negative for chest pain and leg swelling.  Gastrointestinal: Negative.  Negative for abdominal pain.  Genitourinary: Negative.   Musculoskeletal: Negative.   Neurological: Negative.  Negative for weakness.  Psychiatric/Behavioral: Negative.  The patient is not nervous/anxious.     As per HPI. Otherwise, a complete review of systems is negative.  PAST MEDICAL HISTORY: Past Medical History:  Diagnosis Date  . Asthma   . B12 deficiency   . Breast cancer ([place]) 2014   with radiation  . Cataract   . Diabetes mellitus without complication ([place])   . Family history of iron deficiency anemia   . Hypertension   . Mental retardation   . Mixed hyperlipidemia   . Schizophrenia ([place])     PAST SURGICAL HISTORY: Past Surgical History:  Procedure  Laterality Date  . ABDOMINAL HYSTERECTOMY    . BREAST EXCISIONAL BIOPSY Right 2014  . MASTECTOMY, PARTIAL      FAMILY HISTORY:  Diabetes and hypertension.     ADVANCED DIRECTIVES:    HEALTH MAINTENANCE: Social History   Tobacco Use  . Smoking status: Never Smoker  . Smokeless tobacco: Never Used  Substance Use Topics  . Alcohol use: No  . Drug use: No     Colonoscopy:  PAP:  Bone density:  Lipid panel:  No Known Allergies  Current Outpatient Medications  Medication Sig Dispense Refill  . Acetaminophen 500 MG coapsule Take by mouth.    . canagliflozin (INVOKANA) 100 MG TABS tablet Take by mouth.    . docusate sodium (STOOL SOFTENER) 100 MG capsule Take by mouth.    . enalapril (VASOTEC) 10 MG tablet TAKE ONE TABLET BY MOUTH EVERY MORNING.    . fluPHENAZine (PROLIXIN) 1 MG tablet     . fluticasone (FLONASE) 50 MCG/ACT nasal spray     . furosemide (LASIX) 20 MG tablet     . glimepiride (AMARYL) 4 MG tablet Take by mouth.    Marland Kitchen ketorolac (ACULAR) 0.5 % ophthalmic solution     . lansoprazole (PREVACID) 30 MG capsule Take 1 capsule (30 mg total) by mouth daily at 12 noon. 30 capsule 2  . letrozole (FEMARA) 2.5 MG tablet TAKE ONE TABLET BY MOUTH EVERY DAY 90 tablet 3  . linagliptin (TRADJENTA) 5 MG TABS tablet TAKE 1 TABLET BY MOUTH EVERY DAY.    . metFORMIN (GLUCOPHAGE) 1000 MG tablet     .  moxifloxacin (VIGAMOX) 0.5 % ophthalmic solution     . ranitidine (ZANTAC) 150 MG tablet     . VENTOLIN HFA 108 (90 Base) MCG/ACT inhaler     . azelastine (ASTELIN) 0.1 % nasal spray Place into the nose.     No current facility-administered medications for this visit.     OBJECTIVE: Vitals:   01/31/18 1128  BP: 117/75  Pulse: (!) 105  Resp: 18  Temp: (!) 97.5 F (36.4 C)     There is no height or weight on file to calculate BMI.    ECOG FS:0 - Asymptomatic  General: Well-developed, well-nourished, no acute distress. Eyes: anicteric sclera. Breasts: Right breast with  thickened mildly edematous skin, but no distinct masses.  Bilateral breast and axilla otherwise normal. Lungs: Clear to auscultation bilaterally. Heart: Regular rate and rhythm. No rubs, murmurs, or gallops. Abdomen: Soft, nontender, nondistended. No organomegaly noted, normoactive bowel sounds. Musculoskeletal: No edema, cyanosis, or clubbing. Neuro: Alert, answering all questions appropriately. Cranial nerves grossly intact. Skin: No rashes or petechiae noted. Psych: Normal affect.    LAB RESULTS:  Lab Results  Component Value Date   NA 138 07/15/2013   K 4.0 07/15/2013   CL 105 07/15/2013   CO2 28 07/15/2013   GLUCOSE 130 (H) 07/15/2013   BUN 15 07/15/2013   CREATININE 0.85 07/15/2013   CALCIUM 9.6 07/15/2013   PROT 7.9 07/15/2013   ALBUMIN 3.7 07/15/2013   AST 29 07/15/2013   ALT 37 07/15/2013   ALKPHOS 93 07/15/2013   BILITOT 0.3 07/15/2013   GFRNONAA >60 07/15/2013   GFRAA >60 07/15/2013    Lab Results  Component Value Date   WBC 6.7 11/11/2013   NEUTROABS 4.9 11/11/2013   HGB 12.4 11/11/2013   HCT 38.9 11/11/2013   MCV 80 11/11/2013   PLT 297 11/11/2013   Lab Results  Component Value Date   LABCA2 24.0 12/06/2016     STUDIES: No results found.  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   PLAN:     1.  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 Given patient's decreased mental capacity and likely inability to consent or understand the treatment plan, she did not receive adjuvant chemotherapy. Her most recent mammogram on May 28, 2017 was reported as BI-RADS 2. Repeat in July 2019.  She also had a bone mineral density on July 25, 2016 that was reported as normal with a T score of -0.6. Repeat bone density in September 2019.  Continue with letrozole which  she will take daily for 5 years completing in March of 2020. Return to clinic in 6 months with repeat laboratory work and further evaluation. 2.  Thickened skin on right breast: Patient was evaluated by surgery several months ago who recommended mammogram and biopsy at that time, which was declined.  Patient reports the area has not changed significantly since then.  We briefly discussed biopsy again, but the patient's mother did not wish to put her through a procedure.  We will repeat mammogram in July as above.  If abnormal, patient's mother stated she will agree to biopsy at that time.  Approximately 20 minutes was spent in discussion of which greater than 50% was consultation.  Patient expressed understanding and was in agreement with this plan. She also understands that She can call clinic at any time  with any questions, concerns, or complaints.    Lloyd Huger, MD   01/31/2018 11:32 AM

## 2018-02-01 LAB — CANCER ANTIGEN 27.29: CAN 27.29: 26.4 U/mL (ref 0.0–38.6)

## 2018-02-06 ENCOUNTER — Other Ambulatory Visit: Payer: Medicare Other

## 2018-02-06 ENCOUNTER — Ambulatory Visit: Payer: Medicare Other | Admitting: Oncology

## 2018-02-20 ENCOUNTER — Ambulatory Visit
Admission: RE | Admit: 2018-02-20 | Discharge: 2018-02-20 | Disposition: A | Discharge status: Home / Self Care | Payer: Medicare Other | Source: Ambulatory Visit | Attending: Radiation Oncology | Admitting: Radiation Oncology

## 2018-02-20 ENCOUNTER — Encounter: Payer: Self-pay | Admitting: Radiation Oncology

## 2018-02-20 ENCOUNTER — Other Ambulatory Visit: Payer: Self-pay

## 2018-02-20 VITALS — BP 129/83 | HR 101 | Temp 98.0°F | Resp 20 | Wt 282.2 lb

## 2018-02-20 DIAGNOSIS — Z79811 Long term (current) use of aromatase inhibitors: Secondary | ICD-10-CM | POA: Insufficient documentation

## 2018-02-20 DIAGNOSIS — Z923 Personal history of irradiation: Secondary | ICD-10-CM | POA: Diagnosis not present

## 2018-02-20 DIAGNOSIS — C50211 Malignant neoplasm of upper-inner quadrant of right female breast: Secondary | ICD-10-CM | POA: Insufficient documentation

## 2018-02-20 DIAGNOSIS — Z170 Estrogen receptor positive status [ER+]: Secondary | ICD-10-CM | POA: Diagnosis not present

## 2018-02-20 NOTE — Progress Notes (Signed)
Radiation Oncology Follow up Note  Name: Deborah Castillo   Date:   02/20/2018 MRN:  [id number] DOB: [birth year]    This 57 y.o. female presents to the clinic today for for your follow-up status post whole breast radiation to her right breast for stage IIa invest invasive mammary carcinoma ER/PR positive.  REFERRING PROVIDER: Lorenza Evangelist, MD  HPI: patient is a 57 year old female now seen out 2 years having completed external beam radiation therapy to her right breast for stage IIa ER/PR positive invasive mammary carcinoma. Seen today in routine follow-up she is doing well she has some thickness in the superior aspect of her right breast thought to be scar tissue and radiation changes. Her mammograms which I have reviewed have been fine. She is currently on.Femara tolerate that well without side effect. She specifically denies breast tenderness cough or bone pain.  COMPLICATIONS OF TREATMENT: none  FOLLOW UP COMPLIANCE: keeps appointments   PHYSICAL EXAM:  BP 129/83   Pulse (!) 101   Temp 98 F (36.7 C)   Resp 20   Wt 282 lb 3 oz (128 kg)  Well-developed obese female in NAD. There is some thickening and had still hyperpigmentation the skin of the right breast. No dominant mass or nodularity is noted in either breast in 2 positions examined. No axillary or supraclavicular adenopathy is identified. Well-developed well-nourished patient in NAD. HEENT reveals PERLA, EOMI, discs not visualized.  Oral cavity is clear. No oral mucosal lesions are identified. Neck is clear without evidence of cervical or supraclavicular adenopathy. Lungs are clear to A&P. Cardiac examination is essentially unremarkable with regular rate and rhythm without murmur rub or thrill. Abdomen is benign with no organomegaly or masses noted. Motor sensory and DTR levels are equal and symmetric in the upper and lower extremities. Cranial nerves II through XII are grossly intact. Proprioception is intact. No peripheral  adenopathy or edema is identified. No motor or sensory levels are noted. Crude visual fields are within normal range.  RADIOLOGY RESULTS: ammograms are reviewed and compatible with the above-stated findings BI-RADS 2 benign.  PLAN: present time she continues to do well. At this time I'm going to discontinue follow-up care. She continues close follow-up care with medical oncology. She or he has follow-up mammograms scheduled for August. She continues on Femara without side effect. Patient and family know to call at anytime with any concerns.  I would like to take this opportunity to thank you for allowing me to participate in the care of your patient.Noreene Filbert, MD

## 2018-02-26 ENCOUNTER — Ambulatory Visit: Payer: Medicare Other | Admitting: Radiation Oncology

## 2018-05-30 ENCOUNTER — Ambulatory Visit
Admission: RE | Admit: 2018-05-30 | Discharge: 2018-05-30 | Disposition: A | Discharge status: Home / Self Care | Payer: Medicare Other | Source: Ambulatory Visit | Attending: Oncology | Admitting: Oncology

## 2018-05-30 DIAGNOSIS — Z170 Estrogen receptor positive status [ER+]: Secondary | ICD-10-CM | POA: Diagnosis present

## 2018-05-30 DIAGNOSIS — C50211 Malignant neoplasm of upper-inner quadrant of right female breast: Secondary | ICD-10-CM | POA: Diagnosis present

## 2018-07-16 ENCOUNTER — Other Ambulatory Visit: Payer: Self-pay | Admitting: *Deleted

## 2018-07-16 MED ORDER — LETROZOLE 2.5 MG PO TABS: 2.5000 mg | ORAL_TABLET | Freq: Every day | ORAL | 1 refills | Status: DC

## 2018-07-29 ENCOUNTER — Ambulatory Visit
Admission: RE | Admit: 2018-07-29 | Discharge: 2018-07-29 | Disposition: A | Discharge status: Home / Self Care | Payer: Medicare Other | Source: Ambulatory Visit | Attending: Oncology | Admitting: Oncology

## 2018-07-29 DIAGNOSIS — Z13820 Encounter for screening for osteoporosis: Secondary | ICD-10-CM | POA: Diagnosis not present

## 2018-07-29 DIAGNOSIS — Z170 Estrogen receptor positive status [ER+]: Secondary | ICD-10-CM

## 2018-07-29 DIAGNOSIS — C50211 Malignant neoplasm of upper-inner quadrant of right female breast: Secondary | ICD-10-CM

## 2018-07-29 DIAGNOSIS — Z780 Asymptomatic menopausal state: Secondary | ICD-10-CM | POA: Diagnosis not present

## 2018-07-29 DIAGNOSIS — Z853 Personal history of malignant neoplasm of breast: Secondary | ICD-10-CM | POA: Insufficient documentation

## 2018-07-29 DIAGNOSIS — Z79811 Long term (current) use of aromatase inhibitors: Secondary | ICD-10-CM | POA: Diagnosis not present

## 2018-07-31 ENCOUNTER — Other Ambulatory Visit: Payer: Medicare Other

## 2018-07-31 ENCOUNTER — Ambulatory Visit: Payer: Medicare Other | Admitting: Oncology

## 2018-08-11 NOTE — Progress Notes (Deleted)
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  INTERVAL HISTORY: Patient returns to clinic today for routine 6 month evaluation. She continues to tolerate letrozole well without significant side effects.  She has an area of thickened skin on her right breast that is occasionally painful.  She otherwise feels well and is asymptomatic.  She has no neurologic complaints.  She denies any recent fevers or illnesses.  She has a good appetite.  She denies any chest pain or shortness of breath.  She denies any nausea, vomiting, constipation, or diarrhea.  She has no urinary complaints.  Patient offers no specific complaints today.  REVIEW OF SYSTEMS:   Review of Systems  Constitutional: Negative for fever, malaise/fatigue and weight loss.  HENT: Negative for sore throat.   Respiratory: Negative.  Negative for cough and shortness of breath.   Cardiovascular: Negative.  Negative for chest pain and leg swelling.  Gastrointestinal: Negative.  Negative for abdominal pain.  Genitourinary: Negative.   Musculoskeletal: Negative.   Neurological: Negative.  Negative for weakness.  Psychiatric/Behavioral: Negative.  The patient is not nervous/anxious.     As per HPI. Otherwise, a complete review of systems is negative.  PAST MEDICAL HISTORY: Past Medical History:  Diagnosis Date  . Asthma   . B12 deficiency   . Breast cancer ([place]) 2014   with radiation  . Cataract   . Diabetes mellitus without complication ([place])   . Family history of iron deficiency anemia   . Hypertension   . Mental retardation   . Mixed hyperlipidemia   . Schizophrenia ([place])     PAST SURGICAL HISTORY: Past Surgical History:    Procedure Laterality Date  . ABDOMINAL HYSTERECTOMY    . BREAST EXCISIONAL BIOPSY Right 2014   INVASIVE MAMMARY CARCINOMA  . BREAST LUMPECTOMY Right 2014   INVASIVE MAMMARY CARCINOMA    FAMILY HISTORY:  Diabetes and hypertension.     ADVANCED DIRECTIVES:    HEALTH MAINTENANCE: Social History   Tobacco Use  . Smoking status: Never Smoker  . Smokeless tobacco: Never Used  Substance Use Topics  . Alcohol use: No  . Drug use: No     Colonoscopy:  PAP:  Bone density:  Lipid panel:  No Known Allergies  Current Outpatient Medications  Medication Sig Dispense Refill  . Acetaminophen 500 MG coapsule Take by mouth.    Marland Kitchen azelastine (ASTELIN) 0.1 % nasal spray Place into the nose.    . canagliflozin (INVOKANA) 100 MG TABS tablet Take by mouth.    . docusate sodium (STOOL SOFTENER) 100 MG capsule Take by mouth.    . enalapril (VASOTEC) 10 MG tablet TAKE ONE TABLET BY MOUTH EVERY MORNING.    . fluPHENAZine (PROLIXIN) 1 MG tablet     . fluticasone (FLONASE) 50 MCG/ACT nasal spray     . furosemide (LASIX) 20 MG tablet     . glimepiride (AMARYL) 4 MG tablet Take by mouth.    Marland Kitchen ketorolac (ACULAR) 0.5 % ophthalmic solution     . lansoprazole (PREVACID) 30 MG capsule Take 1 capsule (30 mg total) by mouth daily at 12 noon. 30 capsule 2  . letrozole (FEMARA) 2.5 MG tablet Take 1 tablet (2.5 mg total) by mouth daily. 90 tablet  1  . linagliptin (TRADJENTA) 5 MG TABS tablet TAKE 1 TABLET BY MOUTH EVERY DAY.    . metFORMIN (GLUCOPHAGE) 1000 MG tablet     . moxifloxacin (VIGAMOX) 0.5 % ophthalmic solution     . ranitidine (ZANTAC) 150 MG tablet     . VENTOLIN HFA 108 (90 Base) MCG/ACT inhaler      No current facility-administered medications for this visit.     OBJECTIVE: There were no vitals filed for this visit.   There is no height or weight on file to calculate BMI.    ECOG FS:0 - Asymptomatic  General: Well-developed, well-nourished, no acute distress. Eyes: anicteric  sclera. Breasts: Right breast with thickened mildly edematous skin, but no distinct masses.  Bilateral breast and axilla otherwise normal. Lungs: Clear to auscultation bilaterally. Heart: Regular rate and rhythm. No rubs, murmurs, or gallops. Abdomen: Soft, nontender, nondistended. No organomegaly noted, normoactive bowel sounds. Musculoskeletal: No edema, cyanosis, or clubbing. Neuro: Alert, answering all questions appropriately. Cranial nerves grossly intact. Skin: No rashes or petechiae noted. Psych: Normal affect.    LAB RESULTS:  Lab Results  Component Value Date   NA 138 07/15/2013   K 4.0 07/15/2013   CL 105 07/15/2013   CO2 28 07/15/2013   GLUCOSE 130 (H) 07/15/2013   BUN 15 07/15/2013   CREATININE 0.85 07/15/2013   CALCIUM 9.6 07/15/2013   PROT 7.9 07/15/2013   ALBUMIN 3.7 07/15/2013   AST 29 07/15/2013   ALT 37 07/15/2013   ALKPHOS 93 07/15/2013   BILITOT 0.3 07/15/2013   GFRNONAA >60 07/15/2013   GFRAA >60 07/15/2013    Lab Results  Component Value Date   WBC 6.7 11/11/2013   NEUTROABS 4.9 11/11/2013   HGB 12.4 11/11/2013   HCT 38.9 11/11/2013   MCV 80 11/11/2013   PLT 297 11/11/2013   Lab Results  Component Value Date   LABCA2 24.0 12/06/2016     STUDIES: Dg Bone Density  Result Date: 07/29/2018 EXAM: DUAL X-RAY ABSORPTIOMETRY (DXA) FOR BONE MINERAL DENSITY IMPRESSION: Dear Dr. Grayland Ormond, Your patient Deborah Castillo completed a BMD test on 07/29/2018 using the [employer] (analysis version: 14.10) manufactured by [employer]. The following summarizes the results of our evaluation. PATIENT BIOGRAPHICAL: Name: Deborah Castillo Patient ID: [id number] Birth Date: [birth year] Height: 63.5 in. Gender: Female Exam Date: 07/29/2018 Weight: 284.0 lbs. Indications: Diabetic, Height Loss, High Risk Meds, History of Breast Cancer, History of Chemo, History of Fracture (Adult), Hysterectomy, Oophorectomy Bilateral, Postmenopausal Fractures: Right foot  Treatments: Letrozole, Metformin ASSESSMENT: The BMD measured at AP Spine L1-L4 (L3) is 1.129 g/cm2 with a T-score of -0.5. This patient is considered normal according to [facility]) criteria. L-3 was excluded due to degenerative changes. Site Region Measured Measured WHO Young Adult BMD Date       Age      Classification T-score AP Spine L1-L4 (L3) 07/29/2018 58.4 Normal -0.5 1.129 g/cm2 AP Spine L1-L4 (L3) 07/25/2016 56.4 Normal -0.6 1.106 g/cm2 AP Spine L1-L4 (L3) 01/15/2014 53.9 Normal 0.6 1.258 g/cm2 AP Spine L1-L4 (L3) 03/10/2010 50.0 Normal 0.2 1.207 g/cm2 DualFemur Neck Right 07/29/2018 58.4 Normal -0.2 1.005 g/cm2 DualFemur Neck Right 07/25/2016 56.4 Normal -0.3 1.001 g/cm2 DualFemur Neck Right 03/10/2010 50.0 Normal 1.5 1.242 g/cm2 DualFemur Total Mean 07/29/2018 58.4 Normal 1.4 1.190 g/cm2 DualFemur Total Mean 07/25/2016 56.4 Normal 1.5 1.198 g/cm2 DualFemur Total Mean 03/10/2010 50.0 Normal 2.7 1.348 g/cm2 World Health Organization [facility]) criteria for post-menopausal, Caucasian Women: Normal:  T-score at or above -1 SD Osteopenia:   T-score between -1 and -2.5 SD Osteoporosis: T-score at or below -2.5 SD RECOMMENDATIONS: 1. All patients should optimize calcium and vitamin D intake. 2. Consider FDA-approved medical therapies in postmenopausal women and men aged 11 years and older, based on the following: a. A hip or vertebral(clinical or morphometric) fracture b. T-score < -2.5 at the femoral neck or spine after appropriate evaluation to exclude secondary causes c. Low bone mass (T-score between -1.0 and -2.5 at the femoral neck or spine) and a 10-year probability of a hip fracture > 3% or a 10-year probability of a major osteoporosis-related fracture > 20% based on the [place]-adapted WHO algorithm d. Clinician judgment and/or patient preferences may indicate treatment for people with 10-year fracture probabilities above or below these levels FOLLOW-UP: People with diagnosed cases of  osteoporosis or at high risk for fracture should have regular bone mineral density tests. For patients eligible for Medicare, routine testing is allowed once every 2 years. The testing frequency can be increased to one year for patients who have rapidly progressing disease, those who are receiving or discontinuing medical therapy to restore bone mass, or have additional risk factors. I have reviewed this report, and agree with the above findings. [facility] Electronically Signed   By: Rolm Baptise M.D.   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   PLAN:     1.  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 Given patient's decreased mental capacity and likely inability to consent or understand the treatment plan, she did not receive adjuvant chemotherapy. Her most recent mammogram on May 28, 2017 was reported as BI-RADS 2. Repeat in July 2019.  She also had a bone mineral density on July 25, 2016 that was reported as normal with a T score of -0.6. Repeat bone density in September 2019.  Continue with letrozole which she will take daily for 5 years completing in March of 2020. Return to clinic in 6 months with repeat laboratory work and further evaluation. 2.  Thickened skin on right breast: Patient was evaluated by surgery several months ago who recommended mammogram and biopsy at that time, which was declined.  Patient reports the area has not changed significantly since then.  We briefly discussed biopsy again, but the patient's mother did not wish to put her through a procedure.  We will repeat mammogram in July as above.  If abnormal, patient's mother stated she will agree to biopsy at that time.  Approximately 20 minutes was spent in discussion of which greater than 50%  was consultation.  Patient expressed understanding and was in agreement with this plan. She also understands that She can call clinic at any time with any questions, concerns, or complaints.    Lloyd Huger, MD   08/11/2018 8:16 PM

## 2018-08-14 ENCOUNTER — Inpatient Hospital Stay: Payer: Medicare Other | Admitting: Oncology

## 2018-08-14 ENCOUNTER — Inpatient Hospital Stay: Payer: Medicare Other

## 2018-08-30 ENCOUNTER — Other Ambulatory Visit: Payer: Self-pay | Admitting: *Deleted

## 2018-08-30 DIAGNOSIS — C50919 Malignant neoplasm of unspecified site of unspecified female breast: Secondary | ICD-10-CM

## 2018-09-01 NOTE — Progress Notes (Signed)
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  INTERVAL HISTORY: Patient returns to clinic today for routine six-month evaluation.  She continues to feel well and is at her baseline.  She had a recent bout of what appears to be shingles, but this is resolving.  She continues to tolerate letrozole without significant side effects.  She has no neurologic complaints.  She denies any recent fevers or illnesses.  She has a good appetite.  She denies any chest pain or shortness of breath.  She denies any nausea, vomiting, constipation, or diarrhea.  She has no urinary complaints.  Patient offers no further specific complaints today.  REVIEW OF SYSTEMS:   Review of Systems  Constitutional: Negative.  Negative for fever, malaise/fatigue and weight loss.  HENT: Negative for sore throat.   Respiratory: Negative.  Negative for cough and shortness of breath.   Cardiovascular: Negative.  Negative for chest pain and leg swelling.  Gastrointestinal: Negative.  Negative for abdominal pain.  Genitourinary: Positive for flank pain.  Musculoskeletal: Negative for back pain.  Skin: Positive for rash.  Neurological: Negative.  Negative for focal weakness, weakness and headaches.  Psychiatric/Behavioral: Negative.  The patient is not nervous/anxious.     As per HPI. Otherwise, a complete review of systems is negative.  PAST MEDICAL HISTORY: Past Medical History:  Diagnosis Date  . Asthma   . B12 deficiency   . Breast cancer ([place]) 2014   with radiation  . Cataract   . Diabetes mellitus without complication ([place])   . Family history of iron deficiency anemia   . Hypertension   . Mental retardation   .  Mixed hyperlipidemia   . Schizophrenia ([place])     PAST SURGICAL HISTORY: Past Surgical History:  Procedure Laterality Date  . ABDOMINAL HYSTERECTOMY    . BREAST EXCISIONAL BIOPSY Right 2014   INVASIVE MAMMARY CARCINOMA  . BREAST LUMPECTOMY Right 2014   INVASIVE MAMMARY CARCINOMA    FAMILY HISTORY:  Diabetes and hypertension.     ADVANCED DIRECTIVES:    HEALTH MAINTENANCE: Social History   Tobacco Use  . Smoking status: Never Smoker  . Smokeless tobacco: Never Used  Substance Use Topics  . Alcohol use: No  . Drug use: No     Colonoscopy:  PAP:  Bone density:  Lipid panel:  No Known Allergies  Current Outpatient Medications  Medication Sig Dispense Refill  . Acetaminophen 500 MG coapsule Take by mouth.    . canagliflozin (INVOKANA) 100 MG TABS tablet Take by mouth.    . docusate sodium (STOOL SOFTENER) 100 MG capsule Take by mouth.    . enalapril (VASOTEC) 10 MG tablet TAKE ONE TABLET BY MOUTH EVERY MORNING.    . fluPHENAZine (PROLIXIN) 1 MG tablet     . fluticasone (FLONASE) 50 MCG/ACT nasal spray     . furosemide (LASIX) 20 MG tablet     . glimepiride (AMARYL) 4 MG tablet Take by mouth.    Marland Kitchen ketorolac (ACULAR) 0.5 % ophthalmic solution     . lansoprazole (PREVACID) 30 MG capsule Take 1 capsule (30 mg total) by mouth daily at 12 noon. 30 capsule 2  . letrozole (FEMARA) 2.5 MG tablet Take 1 tablet (2.5 mg total) by mouth  daily. 90 tablet 1  . linagliptin (TRADJENTA) 5 MG TABS tablet TAKE 1 TABLET BY MOUTH EVERY DAY.    . meloxicam (MOBIC) 7.5 MG tablet     . metFORMIN (GLUCOPHAGE) 1000 MG tablet     . moxifloxacin (VIGAMOX) 0.5 % ophthalmic solution     . pioglitazone (ACTOS) 15 MG tablet Take by mouth.    . ranitidine (ZANTAC) 150 MG tablet     . VENTOLIN HFA 108 (90 Base) MCG/ACT inhaler     . azelastine (ASTELIN) 0.1 % nasal spray Place into the nose.     No current facility-administered medications for this visit.     OBJECTIVE: Vitals:   09/02/18  1042  BP: (!) 142/91  Pulse: 89  Resp: 18  Temp: 98.3 F (36.8 C)     There is no height or weight on file to calculate BMI.    ECOG FS:0 - Asymptomatic  General: Well-developed, well-nourished, no acute distress. Eyes: Pink conjunctiva, anicteric sclera. HEENT: Normocephalic, moist mucous membranes. Breast: Patient declined breast exam today. Lungs: Clear to auscultation bilaterally. Heart: Regular rate and rhythm. No rubs, murmurs, or gallops. Abdomen: Soft, nontender, nondistended. No organomegaly noted, normoactive bowel sounds. Musculoskeletal: No edema, cyanosis, or clubbing. Neuro: Alert, answering all questions appropriately. Cranial nerves grossly intact. Skin: Rash on left flank appears to be resolving shingles. Psych: Normal affect.   LAB RESULTS:  Lab Results  Component Value Date   NA 138 07/15/2013   K 4.0 07/15/2013   CL 105 07/15/2013   CO2 28 07/15/2013   GLUCOSE 130 (H) 07/15/2013   BUN 15 07/15/2013   CREATININE 0.85 07/15/2013   CALCIUM 9.6 07/15/2013   PROT 7.9 07/15/2013   ALBUMIN 3.7 07/15/2013   AST 29 07/15/2013   ALT 37 07/15/2013   ALKPHOS 93 07/15/2013   BILITOT 0.3 07/15/2013   GFRNONAA >60 07/15/2013   GFRAA >60 07/15/2013    Lab Results  Component Value Date   WBC 6.7 11/11/2013   NEUTROABS 4.9 11/11/2013   HGB 12.4 11/11/2013   HCT 38.9 11/11/2013   MCV 80 11/11/2013   PLT 297 11/11/2013   Lab Results  Component Value Date   LABCA2 24.0 12/06/2016     STUDIES: No results found.  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   PLAN:     1.  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 Given patient's decreased mental capacity and likely inability to consent or understand the treatment plan, she did not receive adjuvant chemotherapy.   Her most recent mammogram on May 30, 2018 was reported as BI-RADS 3, recommendation was to repeat in 1 year.  Continue with letrozole which she will take daily for 5 years completing in March of 2020.  Will consider extending treatment given the stage of her disease.  Return to clinic in 6 months for routine evaluation. 2.  Bone health: Patient's most recent bone mineral density on July 29, 2018 reported T score of -0.5 which is considered normal.  Repeat in September 2021. 3.  Shingles: Resolving.  Continue treatment as prescribed by primary care.    Patient expressed understanding and was in agreement with this plan. She also understands that She can call clinic at any time with any questions, concerns, or complaints.    Lloyd Huger, MD   09/02/2018 5:55 PM

## 2018-09-02 ENCOUNTER — Inpatient Hospital Stay: Payer: Medicare Other | Attending: Oncology

## 2018-09-02 ENCOUNTER — Inpatient Hospital Stay (HOSPITAL_BASED_OUTPATIENT_CLINIC_OR_DEPARTMENT_OTHER): Payer: Medicare Other | Admitting: Oncology

## 2018-09-02 ENCOUNTER — Encounter: Payer: Self-pay | Admitting: Oncology

## 2018-09-02 VITALS — BP 142/91 | HR 89 | Temp 98.3°F | Resp 18 | Wt 287.5 lb

## 2018-09-02 DIAGNOSIS — E119 Type 2 diabetes mellitus without complications: Secondary | ICD-10-CM | POA: Insufficient documentation

## 2018-09-02 DIAGNOSIS — Z7984 Long term (current) use of oral hypoglycemic drugs: Secondary | ICD-10-CM | POA: Diagnosis not present

## 2018-09-02 DIAGNOSIS — Z170 Estrogen receptor positive status [ER+]: Secondary | ICD-10-CM | POA: Insufficient documentation

## 2018-09-02 DIAGNOSIS — C50211 Malignant neoplasm of upper-inner quadrant of right female breast: Secondary | ICD-10-CM | POA: Diagnosis present

## 2018-09-02 DIAGNOSIS — Z79899 Other long term (current) drug therapy: Secondary | ICD-10-CM | POA: Diagnosis not present

## 2018-09-02 DIAGNOSIS — Z79811 Long term (current) use of aromatase inhibitors: Secondary | ICD-10-CM | POA: Diagnosis not present

## 2018-09-02 DIAGNOSIS — I10 Essential (primary) hypertension: Secondary | ICD-10-CM | POA: Insufficient documentation

## 2018-09-02 DIAGNOSIS — C50919 Malignant neoplasm of unspecified site of unspecified female breast: Secondary | ICD-10-CM

## 2018-09-02 NOTE — Progress Notes (Signed)
Pt in for follow up today, denies any concerns.  

## 2018-09-03 LAB — CANCER ANTIGEN 27.29: CAN 27.29: 20.4 U/mL (ref 0.0–38.6)

## 2019-03-03 ENCOUNTER — Inpatient Hospital Stay: Payer: Medicare Other | Admitting: Oncology

## 2019-03-03 ENCOUNTER — Inpatient Hospital Stay: Payer: Medicare Other

## 2019-04-16 ENCOUNTER — Other Ambulatory Visit: Payer: Self-pay | Admitting: Oncology

## 2019-05-30 ENCOUNTER — Ambulatory Visit: Payer: Medicare Other | Admitting: Oncology

## 2019-06-01 NOTE — Progress Notes (Signed)
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  INTERVAL HISTORY: Patient returns to clinic today for routine 65-monthevaluation.  She continues to feel well and remains asymptomatic.  She is tolerating letrozole without significant side effects.  She has no neurologic complaints.  She denies any recent fevers or illnesses.  She has a good appetite.  She denies any chest pain, shortness of breath, cough, or hemoptysis.  She denies any nausea, vomiting, constipation, or diarrhea.  She has no urinary complaints.  Patient feels at her baseline offers no specific complaints today.    REVIEW OF SYSTEMS:   Review of Systems  Constitutional: Negative.  Negative for fever, malaise/fatigue and weight loss.  HENT: Negative for sore throat.   Respiratory: Negative.  Negative for cough and shortness of breath.   Cardiovascular: Negative.  Negative for chest pain and leg swelling.  Gastrointestinal: Negative.  Negative for abdominal pain.  Genitourinary: Negative.  Negative for flank pain.  Musculoskeletal: Negative.  Negative for back pain.  Skin: Negative.  Negative for rash.  Neurological: Negative.  Negative for dizziness, focal weakness, weakness and headaches.  Psychiatric/Behavioral: Negative.  The patient is not nervous/anxious.     As per HPI. Otherwise, a complete review of systems is negative.  PAST MEDICAL HISTORY: Past Medical History:  Diagnosis Date  . Asthma   . B12 deficiency   . Breast cancer ([place]   with radiation  . Cataract   . Diabetes mellitus without complication ([place]   . Family history of iron deficiency anemia   . Hypertension   . Mental retardation   . Mixed  hyperlipidemia   . [place]izophrenia ([place]     PAST SURGICAL HISTORY: Past Surgical History:  Procedure Laterality Date  . ABDOMINAL HYSTERECTOMY    . BREAST EXCISIONAL BIOPSY Right 2014   INVASIVE MAMMARY CARCINOMA  . BREAST LUMPECTOMY Right 2014   INVASIVE MAMMARY CARCINOMA    FAMILY HISTORY:  Diabetes and hypertension.     ADVANCED DIRECTIVES:    HEALTH MAINTENANCE: Social History   Tobacco Use  . Smoking status: Never Smoker  . Smokeless tobacco: Never Used  Substance Use Topics  . Alcohol use: No  . Drug use: No     Colonoscopy:  PAP:  Bone density:  Lipid panel:  No Known Allergies  Current Outpatient Medications  Medication Sig Dispense Refill  . Acetaminophen 500 MG coapsule Take by mouth.    .Marland Kitchenazelastine (ASTELIN) 0.1 % nasal spray Place into the nose.    . canagliflozin (INVOKANA) 100 MG TABS tablet Take by mouth.    . docusate sodium (STOOL SOFTENER) 100 MG capsule Take by mouth.    . enalapril (VASOTEC) 10 MG tablet TAKE ONE TABLET BY MOUTH EVERY MORNING.    . fluPHENAZine (PROLIXIN) 1 MG tablet     . fluticasone (FLONASE) 50 MCG/ACT nasal spray     . furosemide (LASIX) 20 MG tablet     . glimepiride (AMARYL) 4 MG tablet Take by mouth.    .Marland Kitchenketorolac (ACULAR) 0.5 % ophthalmic solution     . lansoprazole (PREVACID) 30 MG capsule Take 1 capsule (30 mg total) by mouth daily at 12 noon. 30 capsule 2  . letrozole (FEMARA) 2.5 MG  tablet TAKE 1 TABLET BY MOUTH DAILY 90 tablet 1  . linagliptin (TRADJENTA) 5 MG TABS tablet TAKE 1 TABLET BY MOUTH EVERY DAY.    . meloxicam (MOBIC) 7.5 MG tablet     . metFORMIN (GLUCOPHAGE) 1000 MG tablet     . moxifloxacin (VIGAMOX) 0.5 % ophthalmic solution     . pioglitazone (ACTOS) 15 MG tablet Take by mouth.    . ranitidine (ZANTAC) 150 MG tablet     . VENTOLIN HFA 108 (90 Base) MCG/ACT inhaler      No current facility-administered medications for this visit.     OBJECTIVE: Vitals:   06/06/19 0905  BP: 140/88   Pulse: 80  Temp: 98.7 F (37.1 C)     Body mass index is 50.93 kg/m.    ECOG FS:0 - Asymptomatic  General: Well-developed, well-nourished, no acute distress. Eyes: Pink conjunctiva, anicteric sclera. HEENT: Normocephalic, moist mucous membranes. Breast: Patient declined breast exam today. Lungs: Clear to auscultation bilaterally. Heart: Regular rate and rhythm. No rubs, murmurs, or gallops. Abdomen: Soft, nontender, nondistended. No organomegaly noted, normoactive bowel sounds. Musculoskeletal: No edema, cyanosis, or clubbing. Neuro: Alert, answering all questions appropriately. Cranial nerves grossly intact. Skin: No rashes or petechiae noted. Psych: Normal affect.  LAB RESULTS:  Lab Results  Component Value Date   NA 138 07/15/2013   K 4.0 07/15/2013   CL 105 07/15/2013   CO2 28 07/15/2013   GLUCOSE 130 (H) 07/15/2013   BUN 15 07/15/2013   CREATININE 0.85 07/15/2013   CALCIUM 9.6 07/15/2013   PROT 7.9 07/15/2013   ALBUMIN 3.7 07/15/2013   AST 29 07/15/2013   ALT 37 07/15/2013   ALKPHOS 93 07/15/2013   BILITOT 0.3 07/15/2013   GFRNONAA >60 07/15/2013   GFRAA >60 07/15/2013    Lab Results  Component Value Date   WBC 6.7 11/11/2013   NEUTROABS 4.9 11/11/2013   HGB 12.4 11/11/2013   HCT 38.9 11/11/2013   MCV 80 11/11/2013   PLT 297 11/11/2013   Lab Results  Component Value Date   LABCA2 24.0 12/06/2016     STUDIES: No results found.  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   PLAN:     1.  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 Given patient's decreased mental capacity and likely inability to consent or understand the treatment plan, she did not receive adjuvant chemotherapy.  Her most recent mammogram on May 30, 2018 was reported as BI-RADS 3, this will  need to be repeated in the next 1 to 2 weeks.  Given patient's stage of disease and the fact that she did not receive adjuvant chemotherapy, will give extended treatment of letrozole completing 7 years in March 2022.  Return to clinic in 6 months for routine evaluation.   2.  Bone health: Patient's most recent bone mineral density on July 29, 2018 reported T score of -0.5 which is considered normal.  Repeat in September 2021.  I spent a total of 20 minutes face-to-face with the patient of which greater than 50% of the visit was spent in counseling and coordination of care as detailed above.   Patient expressed understanding and was in agreement with this plan. She also understands that She can call clinic at any time with any questions, concerns, or complaints.    Lloyd Huger, MD  06/07/2019 7:51 AM

## 2019-06-06 ENCOUNTER — Other Ambulatory Visit: Payer: Self-pay

## 2019-06-06 ENCOUNTER — Inpatient Hospital Stay: Payer: Medicare Other | Attending: Oncology | Admitting: Oncology

## 2019-06-06 ENCOUNTER — Encounter: Payer: Self-pay | Admitting: Oncology

## 2019-06-06 VITALS — BP 140/88 | HR 80 | Temp 98.7°F | Ht 63.0 in

## 2019-06-06 DIAGNOSIS — E538 Deficiency of other specified B group vitamins: Secondary | ICD-10-CM | POA: Diagnosis not present

## 2019-06-06 DIAGNOSIS — I10 Essential (primary) hypertension: Secondary | ICD-10-CM | POA: Diagnosis not present

## 2019-06-06 DIAGNOSIS — Z79899 Other long term (current) drug therapy: Secondary | ICD-10-CM | POA: Insufficient documentation

## 2019-06-06 DIAGNOSIS — Z170 Estrogen receptor positive status [ER+]: Secondary | ICD-10-CM | POA: Diagnosis not present

## 2019-06-06 DIAGNOSIS — C50211 Malignant neoplasm of upper-inner quadrant of right female breast: Secondary | ICD-10-CM

## 2019-06-06 DIAGNOSIS — F209 Schizophrenia, unspecified: Secondary | ICD-10-CM | POA: Insufficient documentation

## 2019-06-06 DIAGNOSIS — E782 Mixed hyperlipidemia: Secondary | ICD-10-CM | POA: Diagnosis not present

## 2019-06-06 DIAGNOSIS — Z90710 Acquired absence of both cervix and uterus: Secondary | ICD-10-CM | POA: Diagnosis not present

## 2019-06-06 DIAGNOSIS — Z791 Long term (current) use of non-steroidal anti-inflammatories (NSAID): Secondary | ICD-10-CM | POA: Diagnosis not present

## 2019-06-06 DIAGNOSIS — E119 Type 2 diabetes mellitus without complications: Secondary | ICD-10-CM | POA: Diagnosis not present

## 2019-06-06 DIAGNOSIS — J45909 Unspecified asthma, uncomplicated: Secondary | ICD-10-CM | POA: Diagnosis not present

## 2019-06-06 DIAGNOSIS — Z7984 Long term (current) use of oral hypoglycemic drugs: Secondary | ICD-10-CM | POA: Diagnosis not present

## 2019-06-06 NOTE — Progress Notes (Signed)
Patient stated that she had been doing well with no complaints. 

## 2019-07-03 ENCOUNTER — Inpatient Hospital Stay: Admission: RE | Admit: 2019-07-03 | Payer: Medicare Other | Source: Ambulatory Visit

## 2019-10-23 ENCOUNTER — Telehealth: Payer: Self-pay | Admitting: Hematology and Oncology

## 2019-10-23 NOTE — Telephone Encounter (Signed)
Re:  Discomfort in shoulder and breast  Patient notes discomfort in breast.  Swelling.  No fever.  Last visit 06/06/2019.  No interval mammogram (last 05/30/2018).  We discussed assessment in clinic.  Patient would like to be seen in clinic after Christmas.   Lequita Asal, MD

## 2019-10-27 ENCOUNTER — Encounter: Payer: Self-pay | Admitting: *Deleted

## 2019-10-27 NOTE — Progress Notes (Signed)
Received request from Dr. Grayland Ormond to contact patient.  She had called complaining of breast problems.  No answer and no voicemail set up.  Will try again later today.

## 2019-10-28 ENCOUNTER — Encounter: Payer: Self-pay | Admitting: *Deleted

## 2019-10-28 NOTE — Progress Notes (Signed)
I attempted to call patient again today.  There is no answer and no voicemail.  Patient's number is the same as her mom's number, who is her emergency contact.  Will try again later today and tomorrow.

## 2019-11-05 ENCOUNTER — Other Ambulatory Visit: Payer: Self-pay | Admitting: Oncology

## 2019-11-26 ENCOUNTER — Other Ambulatory Visit: Payer: Self-pay | Admitting: *Deleted

## 2019-12-05 NOTE — Progress Notes (Deleted)
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  INTERVAL HISTORY: Patient returns to clinic today for routine 74-monthevaluation.  She continues to feel well and remains asymptomatic.  She is tolerating letrozole without significant side effects.  She has no neurologic complaints.  She denies any recent fevers or illnesses.  She has a good appetite.  She denies any chest pain, shortness of breath, cough, or hemoptysis.  She denies any nausea, vomiting, constipation, or diarrhea.  She has no urinary complaints.  Patient feels at her baseline offers no specific complaints today.    REVIEW OF SYSTEMS:   Review of Systems  Constitutional: Negative.  Negative for fever, malaise/fatigue and weight loss.  HENT: Negative for sore throat.   Respiratory: Negative.  Negative for cough and shortness of breath.   Cardiovascular: Negative.  Negative for chest pain and leg swelling.  Gastrointestinal: Negative.  Negative for abdominal pain.  Genitourinary: Negative.  Negative for flank pain.  Musculoskeletal: Negative.  Negative for back pain.  Skin: Negative.  Negative for rash.  Neurological: Negative.  Negative for dizziness, focal weakness, weakness and headaches.  Psychiatric/Behavioral: Negative.  The patient is not nervous/anxious.     As per HPI. Otherwise, a complete review of systems is negative.  PAST MEDICAL HISTORY: Past Medical History:  Diagnosis Date  . Asthma   . B12 deficiency   . [place]   with radiation  . Cataract   . [place]   . Family history of iron deficiency anemia   . Hypertension   . Mental retardation   . Mixed  hyperlipidemia   . Schizophrenia ([facility]     PAST SURGICAL HISTORY: Past Surgical History:  Procedure Laterality Date  . ABDOMINAL HYSTERECTOMY    . BREAST EXCISIONAL BIOPSY Right 2014   INVASIVE MAMMARY CARCINOMA  . BREAST LUMPECTOMY Right 2014   INVASIVE MAMMARY CARCINOMA    FAMILY HISTORY:  Diabetes and hypertension.     ADVANCED DIRECTIVES:    HEALTH MAINTENANCE: Social History   Tobacco Use  . Smoking status: Never Smoker  . Smokeless tobacco: Never Used  Substance Use Topics  . Alcohol use: No  . Drug use: No     Colonoscopy:  PAP:  Bone density:  Lipid panel:  No Known Allergies  Current Outpatient Medications  Medication Sig Dispense Refill  . Acetaminophen 500 MG coapsule Take by mouth.    .Marland Kitchenazelastine (ASTELIN) 0.1 % nasal spray Place into the nose.    . canagliflozin (INVOKANA) 100 MG TABS tablet Take by mouth.    . docusate sodium (STOOL SOFTENER) 100 MG capsule Take by mouth.    . enalapril (VASOTEC) 10 MG tablet TAKE ONE TABLET BY MOUTH EVERY MORNING.    . fluPHENAZine (PROLIXIN) 1 MG tablet     . fluticasone (FLONASE) 50 MCG/ACT nasal spray     . furosemide (LASIX) 20 MG tablet     . glimepiride (AMARYL) 4 MG tablet Take by mouth.    .Marland Kitchenketorolac (ACULAR) 0.5 % ophthalmic solution     . lansoprazole (PREVACID) 30 MG capsule Take 1 capsule (30 mg total) by mouth daily at 12 noon. 30 capsule 2  . letrozole (FEMARA) 2.5 MG  tablet TAKE 1 TABLET BY MOUTH DAILY 90 tablet 1  . linagliptin (TRADJENTA) 5 MG TABS tablet TAKE 1 TABLET BY MOUTH EVERY DAY.    . meloxicam (MOBIC) 7.5 MG tablet     . metFORMIN (GLUCOPHAGE) 1000 MG tablet     . moxifloxacin (VIGAMOX) 0.5 % ophthalmic solution     . pioglitazone (ACTOS) 15 MG tablet Take by mouth.    . ranitidine (ZANTAC) 150 MG tablet     . VENTOLIN HFA 108 (90 Base) MCG/ACT inhaler      No current facility-administered medications for this visit.    OBJECTIVE: There were no vitals filed for this visit.    There is no height or weight on file to calculate BMI.    ECOG FS:0 - Asymptomatic  General: Well-developed, well-nourished, no acute distress. Eyes: Pink conjunctiva, anicteric sclera. HEENT: Normocephalic, moist mucous membranes. Breast: Patient declined breast exam today. Lungs: Clear to auscultation bilaterally. Heart: Regular rate and rhythm. No rubs, murmurs, or gallops. Abdomen: Soft, nontender, nondistended. No organomegaly noted, normoactive bowel sounds. Musculoskeletal: No edema, cyanosis, or clubbing. Neuro: Alert, answering all questions appropriately. Cranial nerves grossly intact. Skin: No rashes or petechiae noted. Psych: Normal affect.  LAB RESULTS:  Lab Results  Component Value Date   NA 138 07/15/2013   K 4.0 07/15/2013   CL 105 07/15/2013   CO2 28 07/15/2013   GLUCOSE 130 (H) 07/15/2013   BUN 15 07/15/2013   CREATININE 0.85 07/15/2013   CALCIUM 9.6 07/15/2013   PROT 7.9 07/15/2013   ALBUMIN 3.7 07/15/2013   AST 29 07/15/2013   ALT 37 07/15/2013   ALKPHOS 93 07/15/2013   BILITOT 0.3 07/15/2013   GFRNONAA >60 07/15/2013   GFRAA >60 07/15/2013    Lab Results  Component Value Date   WBC 6.7 11/11/2013   NEUTROABS 4.9 11/11/2013   HGB 12.4 11/11/2013   HCT 38.9 11/11/2013   MCV 80 11/11/2013   PLT 297 11/11/2013   Lab Results  Component Value Date   LABCA2 24.0 12/06/2016     STUDIES: No results found.  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   PLAN:     1.  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 Given patient's decreased mental capacity and likely inability to consent or understand the treatment plan, she did not receive adjuvant chemotherapy.  Her most recent mammogram on May 30, 2018 was reported as BI-RADS 3, this will need to be repeated  in the next 1 to 2 weeks.  Given patient's stage of disease and the fact that she did not receive adjuvant chemotherapy, will give extended treatment of letrozole completing 7 years in March 2022.  Return to clinic in 6 months for routine evaluation.   2.  Bone health: Patient's most recent bone mineral density on July 29, 2018 reported T score of -0.5 which is considered normal.  Repeat in September 2021.  I spent a total of 20 minutes face-to-face with the patient of which greater than 50% of the visit was spent in counseling and coordination of care as detailed above.   Patient expressed understanding and was in agreement with this plan. She also understands that She can call clinic at any time with any questions, concerns, or complaints.    Lloyd Huger, MD   12/05/2019 6:41 AM

## 2019-12-11 ENCOUNTER — Inpatient Hospital Stay: Payer: Medicare Other | Admitting: Oncology

## 2019-12-15 ENCOUNTER — Telehealth: Payer: Self-pay | Admitting: Oncology

## 2019-12-15 NOTE — Telephone Encounter (Signed)
I have attempted to contact patient and her mother to get appts rescheduled for mammo and follow-up with Woodfin Ganja. She will call the center and when I return her call, no one will answer. Her VM does not currently accept messages.

## 2020-01-29 ENCOUNTER — Ambulatory Visit
Admission: RE | Admit: 2020-01-29 | Discharge: 2020-01-29 | Disposition: A | Discharge status: Home / Self Care | Payer: Medicare Other | Source: Ambulatory Visit | Attending: Oncology | Admitting: Oncology

## 2020-01-29 DIAGNOSIS — Z170 Estrogen receptor positive status [ER+]: Secondary | ICD-10-CM | POA: Insufficient documentation

## 2020-01-29 DIAGNOSIS — C50211 Malignant neoplasm of upper-inner quadrant of right female breast: Secondary | ICD-10-CM | POA: Diagnosis not present

## 2020-01-29 HISTORY — DX: Personal history of irradiation: Z92.3

## 2020-05-10 ENCOUNTER — Other Ambulatory Visit: Payer: Self-pay | Admitting: Oncology

## 2020-07-12 ENCOUNTER — Other Ambulatory Visit: Payer: Self-pay | Admitting: Oncology

## 2020-08-06 NOTE — Progress Notes (Signed)
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  INTERVAL HISTORY: Patient returns to clinic today for routine 69-monthevaluation.  She had a fall recently bruising her right shin, but otherwise has felt well and is asymptomatic.  She continues to tolerate letrozole without significant side effects.  She has no neurologic complaints.  She denies any recent fevers or illnesses.  She has a good appetite.  She denies any chest pain, shortness of breath, cough, or hemoptysis.  She denies any nausea, vomiting, constipation, or diarrhea.  She has no urinary complaints.  Patient offers no further specific complaints today.  REVIEW OF SYSTEMS:   Review of Systems  Constitutional: Negative.  Negative for fever, malaise/fatigue and weight loss.  HENT: Negative for sore throat.   Respiratory: Negative.  Negative for cough and shortness of breath.   Cardiovascular: Negative.  Negative for chest pain and leg swelling.  Gastrointestinal: Negative.  Negative for abdominal pain.  Genitourinary: Negative.  Negative for flank pain.  Musculoskeletal: Negative.  Negative for back pain.  Skin: Negative.  Negative for rash.  Neurological: Negative.  Negative for dizziness, focal weakness, weakness and headaches.  Psychiatric/Behavioral: Negative.  The patient is not nervous/anxious.     As per HPI. Otherwise, a complete review of systems is negative.  PAST MEDICAL HISTORY: Past Medical History:  Diagnosis Date  . Asthma   . B12 deficiency   . Breast cancer ([place] 2014   with radiation  . Cataract   . Diabetes mellitus without complication ([place]   . Family history of iron deficiency anemia   . Hypertension     . Mental retardation   . Mixed hyperlipidemia   . Personal history of radiation therapy   . Schizophrenia ([place]     PAST SURGICAL HISTORY: Past Surgical History:  Procedure Laterality Date  . ABDOMINAL HYSTERECTOMY    . BREAST BIOPSY Right 2014   positive  . BREAST LUMPECTOMY Right 2014   INVASIVE MAMMARY CARCINOMA    FAMILY HISTORY:  Diabetes and hypertension.     ADVANCED DIRECTIVES:    HEALTH MAINTENANCE: Social History   Tobacco Use  . Smoking status: Never Smoker  . Smokeless tobacco: Never Used  Vaping Use  . Vaping Use: Never used  Substance Use Topics  . Alcohol use: No  . Drug use: No     Colonoscopy:  PAP:  Bone density:  Lipid panel:  No Known Allergies  Current Outpatient Medications  Medication Sig Dispense Refill  . Acetaminophen 500 MG coapsule Take by mouth.    . canagliflozin (INVOKANA) 100 MG TABS tablet Take by mouth.    . docusate sodium (STOOL SOFTENER) 100 MG capsule Take by mouth.    . enalapril (VASOTEC) 10 MG tablet TAKE ONE TABLET BY MOUTH EVERY MORNING.    . fluPHENAZine (PROLIXIN) 1 MG tablet     . fluticasone (FLONASE) 50 MCG/ACT nasal spray     . furosemide (LASIX) 20 MG tablet     . glimepiride (AMARYL) 4 MG tablet Take by mouth.    .Marland Kitchenketorolac (ACULAR) 0.5 % ophthalmic solution     . lansoprazole (PREVACID) 30 MG capsule Take 1 capsule (30 mg total) by mouth daily at 12 noon. 30 capsule 2  .  letrozole (FEMARA) 2.5 MG tablet Take 1 tablet (2.5 mg total) by mouth daily. MUST see doctor in the next 30 days or this prescription will NOT be refilled again until you see MD 90 tablet 3  . linagliptin (TRADJENTA) 5 MG TABS tablet TAKE 1 TABLET BY MOUTH EVERY DAY.    . meloxicam (MOBIC) 7.5 MG tablet     . metFORMIN (GLUCOPHAGE) 1000 MG tablet     . moxifloxacin (VIGAMOX) 0.5 % ophthalmic solution     . ranitidine (ZANTAC) 150 MG tablet     . VENTOLIN HFA 108 (90 Base) MCG/ACT inhaler     . azelastine (ASTELIN) 0.1 % nasal spray  Place into the nose.    . pioglitazone (ACTOS) 15 MG tablet Take by mouth.     No current facility-administered medications for this visit.    OBJECTIVE: Vitals:   08/13/20 1003  BP: (!) 168/100  Pulse: (!) 102  Resp: 18  Temp: (!) 97.2 F (36.2 C)     Body mass index is 54.21 kg/m.    ECOG FS:0 - Asymptomatic  General: Well-developed, well-nourished, no acute distress. Eyes: Pink conjunctiva, anicteric sclera. HEENT: Normocephalic, moist mucous membranes. Breast: Patient declined exam today. Lungs: No audible wheezing or coughing. Heart: Regular rate and rhythm. Abdomen: Soft, nontender, no obvious distention. Musculoskeletal: No edema, cyanosis, or clubbing. Neuro: Alert, answering all questions appropriately. Cranial nerves grossly intact. Skin: No rashes or petechiae noted. Psych: Normal affect.  LAB RESULTS:  Lab Results  Component Value Date   NA 138 07/15/2013   K 4.0 07/15/2013   CL 105 07/15/2013   CO2 28 07/15/2013   GLUCOSE 130 (H) 07/15/2013   BUN 15 07/15/2013   CREATININE 0.85 07/15/2013   CALCIUM 9.6 07/15/2013   PROT 7.9 07/15/2013   ALBUMIN 3.7 07/15/2013   AST 29 07/15/2013   ALT 37 07/15/2013   ALKPHOS 93 07/15/2013   BILITOT 0.3 07/15/2013   GFRNONAA >60 07/15/2013   GFRAA >60 07/15/2013    Lab Results  Component Value Date   WBC 6.7 11/11/2013   NEUTROABS 4.9 11/11/2013   HGB 12.4 11/11/2013   HCT 38.9 11/11/2013   MCV 80 11/11/2013   PLT 297 11/11/2013   Lab Results  Component Value Date   LABCA2 24.0 12/06/2016     STUDIES: No results found.  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   PLAN:     1.  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  Her most recent mammogram on February 01, 2020 was reported as BI-RADS 2.  Repeat in April 2022.  Plan to give  extended letrozole since she did not receive chemotherapy completing 10 years of treatment in March 2025.  Return to clinic in 6 months for routine evaluation.   2.  Bone health: Patient's most recent bone mineral density on July 29, 2018 reported T score of -0.5 which is considered normal.  Repeat in April 2022 along with mammogram.  I spent a total of 20 minutes reviewing chart data, face-to-face evaluation with the patient, counseling and coordination of care as detailed above.   Patient expressed understanding and was in agreement with this plan. She also understands that She can call clinic at any time with any questions, concerns, or complaints.    Lloyd Huger, MD   08/13/2020 12:25 PM

## 2020-08-13 ENCOUNTER — Other Ambulatory Visit: Payer: Self-pay

## 2020-08-13 ENCOUNTER — Encounter: Payer: Self-pay | Admitting: Oncology

## 2020-08-13 ENCOUNTER — Inpatient Hospital Stay: Payer: Medicare Other | Attending: Oncology | Admitting: Oncology

## 2020-08-13 VITALS — BP 168/100 | HR 102 | Temp 97.2°F | Resp 18 | Wt 306.0 lb

## 2020-08-13 DIAGNOSIS — Z833 Family history of diabetes mellitus: Secondary | ICD-10-CM | POA: Insufficient documentation

## 2020-08-13 DIAGNOSIS — I10 Essential (primary) hypertension: Secondary | ICD-10-CM | POA: Diagnosis not present

## 2020-08-13 DIAGNOSIS — J45909 Unspecified asthma, uncomplicated: Secondary | ICD-10-CM | POA: Insufficient documentation

## 2020-08-13 DIAGNOSIS — C50311 Malignant neoplasm of lower-inner quadrant of right female breast: Secondary | ICD-10-CM | POA: Diagnosis present

## 2020-08-13 DIAGNOSIS — Z791 Long term (current) use of non-steroidal anti-inflammatories (NSAID): Secondary | ICD-10-CM | POA: Insufficient documentation

## 2020-08-13 DIAGNOSIS — Z170 Estrogen receptor positive status [ER+]: Secondary | ICD-10-CM | POA: Diagnosis not present

## 2020-08-13 DIAGNOSIS — E782 Mixed hyperlipidemia: Secondary | ICD-10-CM | POA: Insufficient documentation

## 2020-08-13 DIAGNOSIS — Z79811 Long term (current) use of aromatase inhibitors: Secondary | ICD-10-CM | POA: Diagnosis not present

## 2020-08-13 DIAGNOSIS — C50211 Malignant neoplasm of upper-inner quadrant of right female breast: Secondary | ICD-10-CM

## 2020-08-13 DIAGNOSIS — Z8249 Family history of ischemic heart disease and other diseases of the circulatory system: Secondary | ICD-10-CM | POA: Insufficient documentation

## 2020-08-13 DIAGNOSIS — Z923 Personal history of irradiation: Secondary | ICD-10-CM | POA: Insufficient documentation

## 2020-08-13 DIAGNOSIS — Z79899 Other long term (current) drug therapy: Secondary | ICD-10-CM | POA: Diagnosis not present

## 2020-08-13 DIAGNOSIS — Z7984 Long term (current) use of oral hypoglycemic drugs: Secondary | ICD-10-CM | POA: Insufficient documentation

## 2020-08-13 DIAGNOSIS — E538 Deficiency of other specified B group vitamins: Secondary | ICD-10-CM | POA: Diagnosis not present

## 2020-08-13 MED ORDER — LETROZOLE 2.5 MG PO TABS
2.5000 mg | ORAL_TABLET | Freq: Every day | ORAL | 3 refills | Status: DC
Start: 2020-08-13 — End: 2020-08-23

## 2020-08-13 NOTE — Progress Notes (Signed)
Patient here today for follow up regarding breast cancer. Patient reports fall yesterday, pain to right knee.

## 2020-08-23 ENCOUNTER — Other Ambulatory Visit: Payer: Self-pay | Admitting: Oncology

## 2021-01-31 ENCOUNTER — Other Ambulatory Visit: Payer: Medicare Other

## 2021-02-04 ENCOUNTER — Inpatient Hospital Stay: Payer: Medicare Other | Admitting: Oncology

## 2021-02-04 ENCOUNTER — Encounter: Payer: Self-pay | Admitting: Oncology

## 2021-02-08 ENCOUNTER — Other Ambulatory Visit: Payer: Self-pay | Admitting: Oncology

## 2021-02-28 ENCOUNTER — Inpatient Hospital Stay: Admission: RE | Admit: 2021-02-28 | Payer: Medicare Other | Source: Ambulatory Visit

## 2021-02-28 ENCOUNTER — Other Ambulatory Visit: Payer: Medicare Other

## 2021-03-04 ENCOUNTER — Inpatient Hospital Stay: Payer: Medicare Other | Admitting: Oncology

## 2021-03-16 ENCOUNTER — Other Ambulatory Visit: Payer: Self-pay

## 2021-03-16 ENCOUNTER — Ambulatory Visit
Admission: RE | Admit: 2021-03-16 | Discharge: 2021-03-16 | Disposition: A | Discharge status: Home / Self Care | Payer: Medicare Other | Source: Ambulatory Visit | Attending: Oncology | Admitting: Oncology

## 2021-03-16 DIAGNOSIS — Z170 Estrogen receptor positive status [ER+]: Secondary | ICD-10-CM

## 2021-03-16 DIAGNOSIS — C50211 Malignant neoplasm of upper-inner quadrant of right female breast: Secondary | ICD-10-CM | POA: Diagnosis present

## 2021-04-06 ENCOUNTER — Other Ambulatory Visit: Payer: Self-pay | Admitting: *Deleted

## 2021-04-06 MED ORDER — LETROZOLE 2.5 MG PO TABS: 2.5000 mg | ORAL_TABLET | Freq: Every day | ORAL | 1 refills | Status: DC

## 2021-08-01 ENCOUNTER — Other Ambulatory Visit: Payer: Self-pay | Admitting: Oncology

## 2021-08-18 ENCOUNTER — Other Ambulatory Visit: Payer: Self-pay

## 2021-08-18 ENCOUNTER — Inpatient Hospital Stay: Payer: Medicare Other | Attending: Internal Medicine | Admitting: Oncology

## 2021-08-18 ENCOUNTER — Encounter: Payer: Self-pay | Admitting: Oncology

## 2021-08-18 VITALS — BP 167/110 | HR 129 | Temp 97.8°F | Resp 20 | Wt 308.7 lb

## 2021-08-18 DIAGNOSIS — Z79811 Long term (current) use of aromatase inhibitors: Secondary | ICD-10-CM | POA: Insufficient documentation

## 2021-08-18 DIAGNOSIS — Z923 Personal history of irradiation: Secondary | ICD-10-CM | POA: Diagnosis not present

## 2021-08-18 DIAGNOSIS — Z170 Estrogen receptor positive status [ER+]: Secondary | ICD-10-CM | POA: Insufficient documentation

## 2021-08-18 DIAGNOSIS — Z1231 Encounter for screening mammogram for malignant neoplasm of breast: Secondary | ICD-10-CM | POA: Diagnosis not present

## 2021-08-18 DIAGNOSIS — C50211 Malignant neoplasm of upper-inner quadrant of right female breast: Secondary | ICD-10-CM | POA: Diagnosis present

## 2021-08-18 MED ORDER — LETROZOLE 2.5 MG PO TABS: 2.5000 mg | ORAL_TABLET | Freq: Every day | ORAL | 1 refills | Status: DC

## 2021-08-18 NOTE — Progress Notes (Signed)
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  INTERVAL HISTORY: Deborah Castillo is a 60 year old female with past medical history significant for hypertension asthma, type 2 diabetes, mental retardation, hyperlipidemia, morbid obesity, schizophrenia who is followed by Dr. Grayland Ormond for right breast cancer.  Deborah Castillo is here for routine 7-monthevaluation.  Deborah Castillo presents today with her caregiver.  Deborah Castillo lives at a group home.  Deborah Castillo reports overall doing well.  Reports left shoulder and arm discomfort on occasion.  Continues to tolerate letrozole well.  Denies any new side effects.  REVIEW OF SYSTEMS:   Review of Systems  Musculoskeletal:  Positive for joint pain.   As per HPI. Otherwise, a complete review of systems is negative.  PAST MEDICAL HISTORY: Past Medical History:  Diagnosis Date   Asthma    B12 deficiency    [place]   with radiation   Cataract    Diabetes mellitus without complication ([place]    Family history of iron deficiency anemia    Hypertension    Mental retardation    Mixed hyperlipidemia    Personal history of radiation therapy    [place]ia [place]     PAST SURGICAL HISTORY: Past Surgical History:  Procedure Laterality Date   ABDOMINAL HYSTERECTOMY     BREAST BIOPSY Right 2014   positive   BREAST LUMPECTOMY Right 2014   INVASIVE MAMMARY CARCINOMA    FAMILY HISTORY:  Diabetes and hypertension.     ADVANCED DIRECTIVES:    HEALTH MAINTENANCE: Social History   Tobacco Use   Smoking status: Never   Smokeless tobacco: Never  Vaping Use   Vaping Use: Never used  Substance Use Topics   Alcohol use: No   Drug use: No      Colonoscopy:  PAP:  Bone density:  Lipid panel:  No Known Allergies  Current Outpatient Medications  Medication Sig Dispense Refill   Acetaminophen 500 MG coapsule Take by mouth.     azelastine (ASTELIN) 0.1 % nasal spray Place into the nose.     canagliflozin (INVOKANA) 100 MG TABS tablet Take by mouth.     docusate sodium (STOOL SOFTENER) 100 MG capsule Take by mouth.     enalapril (VASOTEC) 10 MG tablet TAKE ONE TABLET BY MOUTH EVERY MORNING.     fluPHENAZine (PROLIXIN) 1 MG tablet      fluticasone (FLONASE) 50 MCG/ACT nasal spray      furosemide (LASIX) 20 MG tablet      glimepiride (AMARYL) 4 MG tablet Take by mouth.     ketorolac (ACULAR) 0.5 % ophthalmic solution      lansoprazole (PREVACID) 30 MG capsule Take 1 capsule (30 mg total) by mouth daily at 12 noon. 30 capsule 2   letrozole (FEMARA) 2.5 MG tablet Take 1 tablet (2.5 mg total) by mouth daily. 90 tablet 1   linagliptin (TRADJENTA) 5 MG TABS tablet TAKE 1 TABLET BY MOUTH EVERY DAY.     meloxicam (MOBIC) 7.5 MG tablet      metFORMIN (GLUCOPHAGE) 1000 MG tablet      moxifloxacin (VIGAMOX) 0.5 % ophthalmic solution      pioglitazone (ACTOS) 15 MG tablet Take by mouth.     ranitidine (ZANTAC) 150 MG tablet  VENTOLIN HFA 108 (90 Base) MCG/ACT inhaler      No current facility-administered medications for this visit.    OBJECTIVE: There were no vitals filed for this visit.    There is no height or weight on file to calculate BMI.    ECOG FS:0 - Asymptomatic  Physical Exam Constitutional:      Appearance: Normal appearance. Deborah Castillo is obese.  HENT:     Head: Normocephalic and atraumatic.  Eyes:     Pupils: Pupils are equal, round, and reactive to light.  Cardiovascular:     Rate and Rhythm: Normal rate and regular rhythm.     Heart sounds: Normal heart sounds. No murmur heard. Pulmonary:     Effort: Pulmonary effort is normal.     Breath sounds: Normal breath sounds. No wheezing.  Abdominal:     General:  Bowel sounds are normal. There is no distension.     Palpations: Abdomen is soft.     Tenderness: There is no abdominal tenderness.  Musculoskeletal:        General: Normal range of motion.     Cervical back: Normal range of motion.  Skin:    General: Skin is warm and dry.     Findings: No rash.  Neurological:     Mental Status: Deborah Castillo is alert and oriented to person, place, and time.  Psychiatric:        Judgment: Judgment normal.    LAB RESULTS:  Lab Results  Component Value Date   NA 138 07/15/2013   K 4.0 07/15/2013   CL 105 07/15/2013   CO2 28 07/15/2013   GLUCOSE 130 (H) 07/15/2013   BUN 15 07/15/2013   CREATININE 0.85 07/15/2013   CALCIUM 9.6 07/15/2013   PROT 7.9 07/15/2013   ALBUMIN 3.7 07/15/2013   AST 29 07/15/2013   ALT 37 07/15/2013   ALKPHOS 93 07/15/2013   BILITOT 0.3 07/15/2013   GFRNONAA >60 07/15/2013   GFRAA >60 07/15/2013    Lab Results  Component Value Date   WBC 6.7 11/11/2013   NEUTROABS 4.9 11/11/2013   HGB 12.4 11/11/2013   HCT 38.9 11/11/2013   MCV 80 11/11/2013   PLT 297 11/11/2013   Lab Results  Component Value Date   LABCA2 24.0 12/06/2016     STUDIES: No results found.  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   PLAN:     1.  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  Her last mammogram is from 03/16/2021 which was read as BI-RADS Category 1 negative.  Most recent bone density scan is from 03/16/2021 which shows a T score of -0.3.  This is considered normal.  Deborah Castillo is taking calcium and vitamin D.  Disposition-RTC 7 months for follow-up with Dr. Grayland Ormond.  Deborah Castillo will need repeat mammogram in May 2023.  Continue letrozole.  I spent 20 minutes dedicated to the care of this patient  (face-to-face and non-face-to-face) on the date of the encounter to include what is described in the assessment and plan.  Patient expressed understanding and was in agreement with this plan. Deborah Castillo also understands that Deborah Castillo can call clinic at any time with any questions, concerns, or complaints.    Jacquelin Hawking, NP   08/18/2021 9:15 AM

## 2022-02-08 ENCOUNTER — Other Ambulatory Visit: Payer: Self-pay | Admitting: *Deleted

## 2022-02-08 MED ORDER — LETROZOLE 2.5 MG PO TABS: 2.5000 mg | ORAL_TABLET | Freq: Every day | ORAL | 1 refills | Status: DC

## 2022-02-08 NOTE — Telephone Encounter (Signed)
Incoming call from Dr Luan Pulling with the [facility] Department of Regulation due to this patient being in a state run facility. She was reviewing patient chart; she is asking if patient should be on Letrozole or not because she has not had any since August. I explained to her that patient was a No Show for several appts and refill was refused stating that she needed an appointment back in October 2022 and she was subsequently seen 08/18/21 and a new prescription was sent for Letrozole to [facility]. Facility was unaware of this as they have [facility] as her pharmacy therefore prescription was never picked up. She is asking what if any detriment this will cause the patient not getting several months of her therapy patient is to be on AI for 10 years to complete in March 2025 ?

## 2022-02-21 ENCOUNTER — Inpatient Hospital Stay: Payer: Medicare Other | Attending: Internal Medicine | Admitting: Medical Oncology

## 2022-02-21 VITALS — BP 167/108 | HR 102 | Temp 99.1°F | Resp 18 | Wt 287.0 lb

## 2022-02-21 DIAGNOSIS — Z79811 Long term (current) use of aromatase inhibitors: Secondary | ICD-10-CM

## 2022-02-21 DIAGNOSIS — C50211 Malignant neoplasm of upper-inner quadrant of right female breast: Secondary | ICD-10-CM

## 2022-02-21 DIAGNOSIS — Z79899 Other long term (current) drug therapy: Secondary | ICD-10-CM | POA: Diagnosis not present

## 2022-02-21 DIAGNOSIS — E1169 Type 2 diabetes mellitus with other specified complication: Secondary | ICD-10-CM | POA: Insufficient documentation

## 2022-02-21 DIAGNOSIS — Z170 Estrogen receptor positive status [ER+]: Secondary | ICD-10-CM | POA: Insufficient documentation

## 2022-02-21 DIAGNOSIS — I159 Secondary hypertension, unspecified: Secondary | ICD-10-CM

## 2022-02-21 DIAGNOSIS — I10 Essential (primary) hypertension: Secondary | ICD-10-CM | POA: Insufficient documentation

## 2022-02-21 MED ORDER — LETROZOLE 2.5 MG PO TABS: 2.5000 mg | ORAL_TABLET | Freq: Every day | ORAL | 1 refills | Status: DC

## 2022-02-21 NOTE — Progress Notes (Signed)
?[place]  ?[phone] [phone] ? ?ID: Deborah Castillo OB: Oct 02, 1961  MR#: [id number]  XHB#:[id number] ? ?Patient Care Team: ?Marinda Elk, MD as PCP - General (Physician Assistant) ? 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 ? ?INTERVAL HISTORY: Deborah Castillo is a 61 year old female with past medical history significant for hypertension asthma, type 2 diabetes, mental retardation, hyperlipidemia, morbid obesity, schizophrenia who is followed by Dr. Grayland Ormond for right breast cancer.  She is here for routine 78-monthevaluation. ? ?She presents today with her caregiver.  She lives at a group home.  States that she is doing well. Continues to tolerate letrozole well with just a few occasional hot flashes.  Denies any new side effects. No lumps, bumps, nipple discharge.  ? ?REVIEW OF SYSTEMS:   ?Review of Systems  ?Musculoskeletal:  Negative for joint pain.  ? ?As per HPI. Otherwise, a complete review of systems is negative. ? ?PAST MEDICAL HISTORY: ?Past Medical History:  ?Diagnosis Date  ? Asthma   ? B12 deficiency   ? [place]  ? with radiation  ? Cataract   ? Diabetes mellitus without complication ([facility]   ? Family history of iron deficiency anemia   ? Hypertension   ? Mental retardation   ? Mixed hyperlipidemia   ? Personal history of radiation therapy   ? Schizophrenia ([place]   ? ? ?PAST SURGICAL HISTORY: ?Past Surgical History:  ?Procedure Laterality Date  ? ABDOMINAL HYSTERECTOMY    ? BREAST BIOPSY Right 2014  ? positive  ? BREAST LUMPECTOMY Right 2014  ? INVASIVE MAMMARY CARCINOMA  ? ? ?FAMILY HISTORY:  Diabetes and hypertension. ? ?  ? ADVANCED DIRECTIVES:  ? ? ?HEALTH MAINTENANCE: ?Social History  ? ?Tobacco Use  ? Smoking status: Never  ? Smokeless tobacco: Never  ?Vaping Use  ? Vaping Use: Never used  ?Substance Use Topics  ? Alcohol use: No  ? Drug use: No   ? ? ? Colonoscopy: ? PAP: ? Bone density: ? Lipid panel: ? ?No Known Allergies ? ?Current Outpatient Medications  ?Medication Sig Dispense Refill  ? Acetaminophen 500 MG coapsule Take by mouth.    ? Azelastine-Fluticasone 137-50 MCG/ACT SUSP Place into the nose.    ? cyanocobalamin 1000 MCG tablet Take 1 tablet by mouth daily.    ? diclofenac Sodium (VOLTAREN) 1 % GEL Apply topically.    ? enalapril (VASOTEC) 10 MG tablet TAKE ONE TABLET BY MOUTH EVERY MORNING.    ? fluPHENAZine (PROLIXIN) 2.5 MG tablet Take 2.5 mg by mouth at bedtime.    ? furosemide (LASIX) 20 MG tablet     ? gabapentin (NEURONTIN) 100 MG capsule Take by mouth.    ? glimepiride (AMARYL) 4 MG tablet Take by mouth.    ? ketoconazole (NIZORAL) 2 % cream SMARTSIG:1 Application Topical 1 to 2 Times Daily    ? ketorolac (ACULAR) 0.5 % ophthalmic solution     ? LORazepam (ATIVAN) 0.5 MG tablet Take 0.5 mg by mouth 2 (two) times daily as needed.    ? metFORMIN (GLUCOPHAGE) 1000 MG tablet     ? metoprolol succinate (TOPROL-XL) 100 MG 24 hr tablet Take 100 mg by mouth daily.    ? canagliflozin (INVOKANA) 100 MG TABS tablet Take by mouth. (Patient not taking: Reported on 02/21/2022)    ? docusate sodium (COLACE) 100 MG capsule Take by mouth. (Patient not taking: Reported on 02/21/2022)    ?  enalapril (VASOTEC) 10 MG tablet Take 1 tablet by mouth 2 (two) times daily. (Patient not taking: Reported on 02/21/2022)    ? fluPHENAZine (PROLIXIN) 1 MG tablet  (Patient not taking: Reported on 02/21/2022)    ? letrozole (FEMARA) 2.5 MG tablet Take 1 tablet (2.5 mg total) by mouth daily. 90 tablet 1  ? linagliptin (TRADJENTA) 5 MG TABS tablet TAKE 1 TABLET BY MOUTH EVERY DAY. (Patient not taking: Reported on 02/21/2022)    ? meloxicam (MOBIC) 7.5 MG tablet  (Patient not taking: Reported on 02/21/2022)    ? metFORMIN (GLUCOPHAGE-XR) 500 MG 24 hr tablet Take 500 mg by mouth 2 (two) times daily. (Patient not taking: Reported on 02/21/2022)    ? ?No current facility-administered  medications for this visit.  ? ? ?OBJECTIVE: ?Vitals:  ? 02/21/22 1042  ?BP: (!) 167/108  ?Pulse: (!) 102  ?Resp: 18  ?Temp: 99.1 ?F (37.3 ?C)  ?SpO2: 99%  ? ?   Body mass index is 50.84 kg/m?Marland Kitchen    ECOG FS:0 - Asymptomatic ? ?Physical Exam ?Constitutional:   ?   Appearance: Normal appearance. She is obese.  ?HENT:  ?   Head: Normocephalic and atraumatic.  ?Eyes:  ?   Pupils: Pupils are equal, round, and reactive to light.  ?Cardiovascular:  ?   Rate and Rhythm: Normal rate and regular rhythm.  ?   Heart sounds: Normal heart sounds. No murmur heard. ?Pulmonary:  ?   Effort: Pulmonary effort is normal.  ?   Breath sounds: Normal breath sounds. No wheezing.  ?Abdominal:  ?   General: Bowel sounds are normal. There is no distension.  ?   Palpations: Abdomen is soft.  ?   Tenderness: There is no abdominal tenderness.  ?Musculoskeletal:     ?   General: Normal range of motion.  ?   Cervical back: Normal range of motion.  ?Skin: ?   General: Skin is warm and dry.  ?   Findings: No rash.  ?Neurological:  ?   Mental Status: She is alert and oriented to person, place, and time.  ?Psychiatric:     ?   Judgment: Judgment normal.  ? ? ?LAB RESULTS: ? ?Lab Results  ?Component Value Date  ? NA 138 07/15/2013  ? K 4.0 07/15/2013  ? CL 105 07/15/2013  ? CO2 28 07/15/2013  ? GLUCOSE 130 (H) 07/15/2013  ? BUN 15 07/15/2013  ? CREATININE 0.85 07/15/2013  ? CALCIUM 9.6 07/15/2013  ? PROT 7.9 07/15/2013  ? ALBUMIN 3.7 07/15/2013  ? AST 29 07/15/2013  ? ALT 37 07/15/2013  ? ALKPHOS 93 07/15/2013  ? BILITOT 0.3 07/15/2013  ? GFRNONAA >60 07/15/2013  ? GFRAA >60 07/15/2013  ? ? ?Lab Results  ?Component Value Date  ? WBC 6.7 11/11/2013  ? NEUTROABS 4.9 11/11/2013  ? HGB 12.4 11/11/2013  ? HCT 38.9 11/11/2013  ? MCV 80 11/11/2013  ? PLT 297 11/11/2013  ? ?Lab Results  ?Component Value Date  ? LABCA2 24.0 12/06/2016  ? ? ? ?STUDIES: ?No results found. ? ?ASSESSMENT: Stage IIa ER/PR positive HER-2 negative adenocarcinoma of the upper inner  quadrant of right breast. ? ? ?PLAN:    ? ?1.  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  Her last mammogram is from 03/16/2021 which was read as BI-RADS Category 1 negative.  She has apending mammogram on 03/17/2022. Most recent bone density scan is from 03/16/2021 which shows a T score of -0.3.  This is considered normal- repeat in 1 year.  She is taking calcium and vitamin D. ? ?2. HTN: Chronic in nature. States that BO is lower at home. They have a BP cuff and will recheck at home. They will alert [place] and let us know if not improved.  ? ?Disposition-RTC 7 months for follow-up with Dr. Grayland Ormond.  She will need repeat mammogram in May 2023.  Continue letrozole. ? ?I spent 20 minutes dedicated to the care of this patient (face-to-face and non-face-to-face) on the date of the encounter to include what is described in the assessment and plan. ? ?Patient expressed understanding and was in agreement with this plan. She also understands that She can call clinic at any time with any questions, concerns, or complaints.  ? ? ?Hughie Closs, PA-C   02/21/2022 10:49 AM ? ? ? ? ?

## 2022-02-21 NOTE — Progress Notes (Signed)
Patient reports feeling like she's a had a mild "cold" and pain in her left arm. APP aware.  ?

## 2022-03-17 ENCOUNTER — Ambulatory Visit
Admission: RE | Admit: 2022-03-17 | Discharge: 2022-03-17 | Disposition: A | Discharge status: Home / Self Care | Payer: Medicare Other | Source: Ambulatory Visit | Attending: Oncology | Admitting: Oncology

## 2022-03-17 DIAGNOSIS — Z170 Estrogen receptor positive status [ER+]: Secondary | ICD-10-CM

## 2022-03-17 DIAGNOSIS — C50211 Malignant neoplasm of upper-inner quadrant of right female breast: Secondary | ICD-10-CM | POA: Insufficient documentation

## 2022-03-17 DIAGNOSIS — Z1231 Encounter for screening mammogram for malignant neoplasm of breast: Secondary | ICD-10-CM | POA: Diagnosis present

## 2022-03-21 ENCOUNTER — Ambulatory Visit: Payer: Medicare Other | Admitting: Oncology

## 2022-03-22 ENCOUNTER — Ambulatory Visit: Payer: Medicare Other | Admitting: Oncology

## 2022-06-02 ENCOUNTER — Emergency Department
Admission: EM | Admit: 2022-06-02 | Discharge: 2022-06-02 | Disposition: A | Discharge status: Home / Self Care | Payer: Medicare Other | Attending: Emergency Medicine | Admitting: Emergency Medicine

## 2022-06-02 ENCOUNTER — Encounter: Payer: Self-pay | Admitting: Emergency Medicine

## 2022-06-02 ENCOUNTER — Other Ambulatory Visit: Payer: Self-pay

## 2022-06-02 ENCOUNTER — Emergency Department: Payer: Medicare Other

## 2022-06-02 DIAGNOSIS — I10 Essential (primary) hypertension: Secondary | ICD-10-CM | POA: Insufficient documentation

## 2022-06-02 DIAGNOSIS — R519 Headache, unspecified: Secondary | ICD-10-CM | POA: Diagnosis present

## 2022-06-02 DIAGNOSIS — E119 Type 2 diabetes mellitus without complications: Secondary | ICD-10-CM | POA: Insufficient documentation

## 2022-06-02 DIAGNOSIS — Z853 Personal history of malignant neoplasm of breast: Secondary | ICD-10-CM | POA: Diagnosis not present

## 2022-06-02 DIAGNOSIS — I160 Hypertensive urgency: Secondary | ICD-10-CM | POA: Diagnosis not present

## 2022-06-02 LAB — BASIC METABOLIC PANEL
Anion gap: 11 (ref 5–15)
BUN: 15 mg/dL (ref 8–23)
CO2: 24 mmol/L (ref 22–32)
Calcium: 9.9 mg/dL (ref 8.9–10.3)
Chloride: 103 mmol/L (ref 98–111)
Creatinine, Ser: 0.77 mg/dL (ref 0.44–1.00)
GFR, Estimated: >60 mL/min (ref >60)
Glucose, Bld: 114 mg/dL — ABNORMAL HIGH (ref 70–99)
Potassium: 3.6 mmol/L (ref 3.5–5.1)
Sodium: 138 mmol/L (ref 135–145)

## 2022-06-02 LAB — CBC WITH DIFFERENTIAL/PLATELET
Abs Immature Granulocytes: 0.02 10*3/uL (ref 0.00–0.07)
Basophils Absolute: 0 10*3/uL (ref 0.0–0.1)
Basophils Relative: 0 %
Eosinophils Absolute: 0.1 10*3/uL (ref 0.0–0.5)
Eosinophils Relative: 1 %
HCT: 44.7 % (ref 36.0–46.0)
Hemoglobin: 13.9 g/dL (ref 12.0–15.0)
Immature Granulocytes: 0 %
Lymphocytes Relative: 28 %
Lymphs Abs: 2.2 10*3/uL (ref 0.7–4.0)
MCH: 25.5 pg — ABNORMAL LOW (ref 26.0–34.0)
MCHC: 31.1 g/dL (ref 30.0–36.0)
MCV: 82 fL (ref 80.0–100.0)
Monocytes Absolute: 0.8 10*3/uL (ref 0.1–1.0)
Monocytes Relative: 10 %
Neutro Abs: 4.6 10*3/uL (ref 1.7–7.7)
Neutrophils Relative %: 61 %
Platelets: 311 10*3/uL (ref 150–400)
RBC: 5.45 MIL/uL — ABNORMAL HIGH (ref 3.87–5.11)
RDW: 14.4 % (ref 11.5–15.5)
WBC: 7.7 10*3/uL (ref 4.0–10.5)
nRBC: 0 % (ref 0.0–0.2)

## 2022-06-02 MED ORDER — CLONIDINE HCL 0.1 MG PO TABS: 0.1000 mg | ORAL_TABLET | Freq: Three times a day (TID) | ORAL | 0 refills | Status: DC

## 2022-06-02 MED ORDER — CLONIDINE HCL 0.1 MG PO TABS
0.1000 mg | ORAL_TABLET | Freq: Once | ORAL | Status: AC
  Administered 2022-06-02: 0.1 mg via ORAL
  Filled 2022-06-02: qty 1

## 2022-06-02 NOTE — ED Notes (Signed)
Pt returned from CT °

## 2022-06-02 NOTE — ED Notes (Addendum)
[place] at [phone] and spoke with an administrator who states that pt will need EMS transport to return to the facility. Secretary called him back and explained requirements for EMS transport; administrator now says that he will call someone to come and pick up pt to return her to the group home. Pt informed of plan for discharge transport.

## 2022-06-02 NOTE — ED Provider Notes (Signed)
[facility] Emergency Department Provider Note     Event Date/Time   First MD Initiated Contact with Patient 06/02/22 1504     (approximate)   History   Hypertension   HPI  Deborah Castillo is a 61 y.o. female with a history of hypertension, diabetes, breast cancer, and schizophrenia presents to the ED via EMS from her group home.  Patient was brought in for reports of high blood pressure.  Patient would endorse some headache at this time.  She has had her blood pressure managed by her primary care provider, and recently had clonidine added to her regimen which includes metoprolol 100 mg twice daily, losartan 100 mg twice daily, and furosemide 20 mg every morning.  I spoke with her group home director as well as her primary caregiver, who endorse that the patient has had all her medicines on schedule including this morning's dose.  They monitored and recorded her blood pressures over the last week, have noted persistently elevated blood pressures in the 458K systolic over the 998P diastolic.  Denies any chest pain, cough, congestion, fevers, chills, or sweats.  Physical Exam   Triage Vital Signs: ED Triage Vitals  Enc Vitals Group     BP 06/02/22 1310 (!) 189/108     Pulse Rate 06/02/22 1310 80     Resp 06/02/22 1310 18     Temp 06/02/22 1310 98 F (36.7 C)     Temp Source 06/02/22 1310 Oral     SpO2 06/02/22 1310 96 %     Weight 06/02/22 1312 285 lb (129.3 kg)     Height 06/02/22 1312 '5\' 3"'$  (1.6 m)     Head Circumference --      Peak Flow --      Pain Score 06/02/22 1311 3     Pain Loc --      Pain Edu? --      Excl. in [place]? --     Most recent vital signs: Vitals:   06/02/22 1532 06/02/22 1656  BP: (!) 181/105 (!) 161/93  Pulse: 89   Resp: 18   Temp: 98.1 F (36.7 C)   SpO2: 96%     General Awake, no distress.  HEENT NCAT. PERRL. EOMI. No rhinorrhea. Mucous membranes are moist. CV:  Good peripheral perfusion.  RESP:  Normal effort.   ABD:  No distention.    ED Results / Procedures / Treatments   Labs (all labs ordered are listed, but only abnormal results are displayed) Labs Reviewed  CBC WITH DIFFERENTIAL/PLATELET - Abnormal; Notable for the following components:      Result Value   RBC 5.45 (*)    MCH 25.5 (*)    All other components within normal limits  BASIC METABOLIC PANEL - Abnormal; Notable for the following components:   Glucose, Bld 114 (*)    All other components within normal limits     EKG    RADIOLOGY  I personally viewed and evaluated these images as part of my medical decision making, as well as reviewing the written report by the radiologist.  ED Provider Interpretation: no acute findings}  CT HEAD WO CONTRAST (5MM)  Result Date: 06/02/2022 CLINICAL DATA:  Provided history: Headache, sudden, severe. EXAM: CT HEAD WITHOUT CONTRAST TECHNIQUE: Contiguous axial images were obtained from the base of the skull through the vertex without intravenous contrast. RADIATION DOSE REDUCTION: This exam was performed according to the departmental dose-optimization program which includes automated exposure control, adjustment  of the mA and/or kV according to patient size and/or use of iterative reconstruction technique. COMPARISON:  No pertinent prior exams available for comparison. FINDINGS: Brain: Cerebral and cerebellar atrophy. 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 Additional small chronic cortically-based infarct versus focal atrophy at the left temporoparietal junction (series 3, image 17). Mild patchy and ill-defined hypoattenuation within the cerebral white matter, nonspecific but compatible with chronic small vessel ischemic disease. 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 There is no acute intracranial hemorrhage. No demarcated cortical infarct. No extra-axial fluid collection. No evidence of an intracranial  mass. No midline shift. Vascular: No hyperdense vessel. Atherosclerotic calcifications. Skull: No fracture or aggressive osseous lesion. Sinuses/Orbits: No mass or acute finding within the imaged orbits. Tiny mucous retention cyst within the left maxillary sinus IMPRESSION: No evidence of acute intracranial abnormality. 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 Possible additional small chronic cortically-based infarct (versus focal atrophy) at the left temporoparietal junction Mild chronic small vessel ischemic changes within the cerebral white matter. 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 Cerebral and cerebellar atrophy. Electronically Signed   By: Kellie Simmering D.O.   On: 06/02/2022 16:12     PROCEDURES:  Critical Care performed: No  Procedures   MEDICATIONS ORDERED IN ED: Medications  cloNIDine (CATAPRES) tablet 0.1 mg (0.1 mg Oral Given 06/02/22 1554)     IMPRESSION / MDM / ASSESSMENT AND PLAN / ED COURSE  I reviewed the triage vital signs and the nursing notes.                              Differential diagnosis includes, but is not limited to, hypertensive emergency, hypertensive urgency, acute stroke, SDH  Patient's presentation is most consistent with severe exacerbation of chronic illness.  Patient to the ED for evaluation of elevated blood pressures, refractory to current medications.  Patient restarted on clonidine twice daily for blood pressure management.  She presents to the ED with reports of systolic BPs in the 814G.  Patient without any acute complaints at this time.  Report of possible headache complaint provided via EMS.  Patient's evaluation is overall reassuring and work-up is unremarkable.  Patient did respond to an additional oral dose of clonidine with repeat blood pressure in the 160s over 90s.  Patient stable at this time for outpatient management.  Patient's diagnosis is consistent with  hypertensive urgency. Patient will be discharged home with prescriptions for clonidine increased to 0.1 mg 3 times daily. Patient is to follow up with primary provider as needed or otherwise directed. Patient is given ED precautions to return to the ED for any worsening or new symptoms.     FINAL CLINICAL IMPRESSION(S) / ED DIAGNOSES   Final diagnoses:  Hypertensive urgency     Rx / DC Orders   ED Discharge Orders          Ordered    cloNIDine (CATAPRES) 0.1 MG tablet  3 times daily        06/02/22 1704             Note:  This document was prepared using Dragon voice recognition software and may include unintentional dictation errors.    Melvenia Needles, PA-C 06/02/22 1939    Carrie Mew, MD 06/12/22 1336

## 2022-06-02 NOTE — ED Notes (Addendum)
First Nurse Note: Patient to ED via [facility] from [facility] for high blood pressure. Patient c/o intermittent headache. Patient takes multiple BP medications. Hx of intellectual disabilities and schizophrenia.   207/119 81HR 99%

## 2022-06-02 NOTE — Discharge Instructions (Addendum)
Ms. Simkin has a normal exam and work-up today.  No evidence of an acute stroke.  She does have elevated blood pressures which have been difficult to control with her blood pressure medicines.  She will have her clonidine increased to 1 tab 3 times daily, along with her other remaining blood pressure medicines.  Follow-up with primary provider next week for routine evaluation.  Return to the ED if necessary.

## 2022-06-02 NOTE — ED Triage Notes (Signed)
Patient brought in by EMS from group home. Patient states she was brought her for hypertension. Patient denies any chest pain or shortness of breath just requesting food in triage. C/o headache. No neuro deficits noted in triage.

## 2022-06-02 NOTE — ED Notes (Signed)
Patient transported to CT 

## 2022-06-20 IMAGING — MG MM DIGITAL SCREENING BILAT W/ TOMO AND CAD
8 of 17 series · 8 of 40 positions shown · non-contrast
Comparison: Previous exam(s).

CLINICAL DATA: Screening. RIGHT lumpectomy 6295

EXAM:
DIGITAL SCREENING BILATERAL MAMMOGRAM WITH TOMOSYNTHESIS AND CAD
TECHNIQUE: Bilateral screening digital craniocaudal and mediolateral oblique
mammograms were obtained. Bilateral screening digital breast
tomosynthesis was performed. The images were evaluated with
computer-aided detection. Best images possible per technologist
communication.

[R MLO synth-2D (1 of 2)]
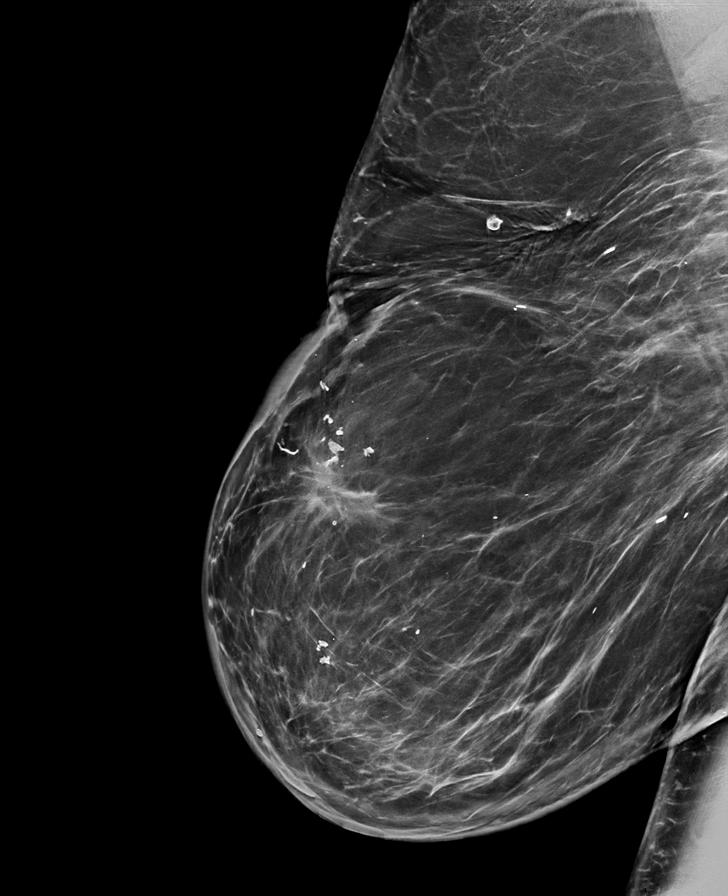

[L CC synth-2D (1 of 2)]
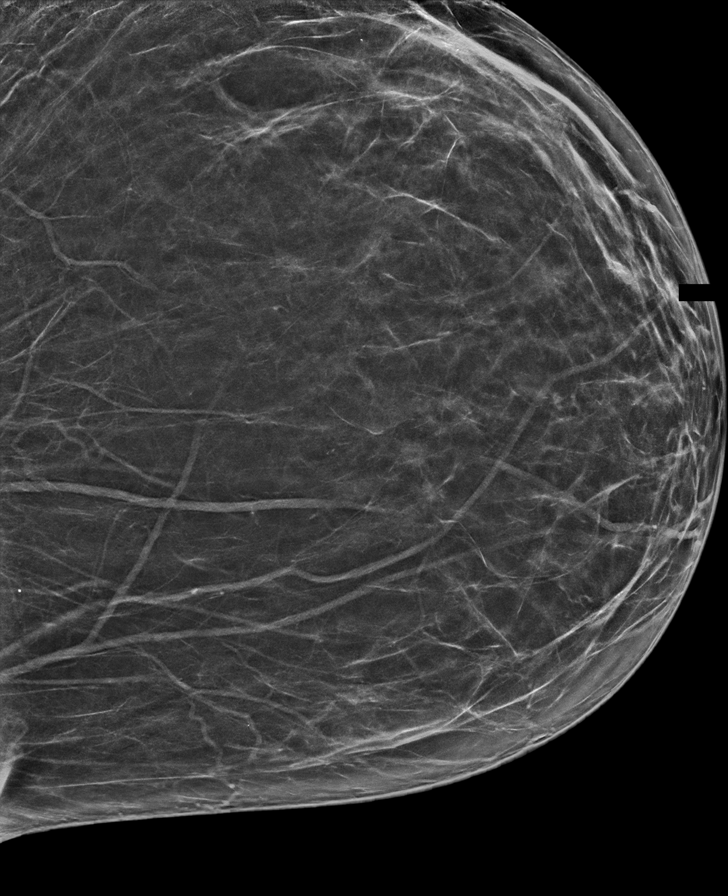

[R CC synth-2D]
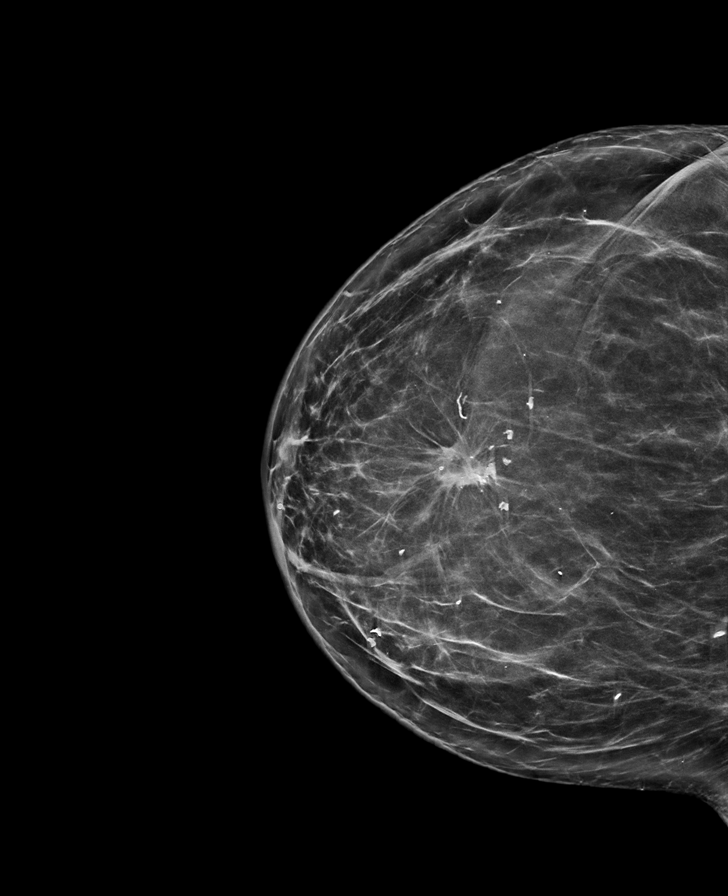

[R MLO synth-2D (2 of 2)]
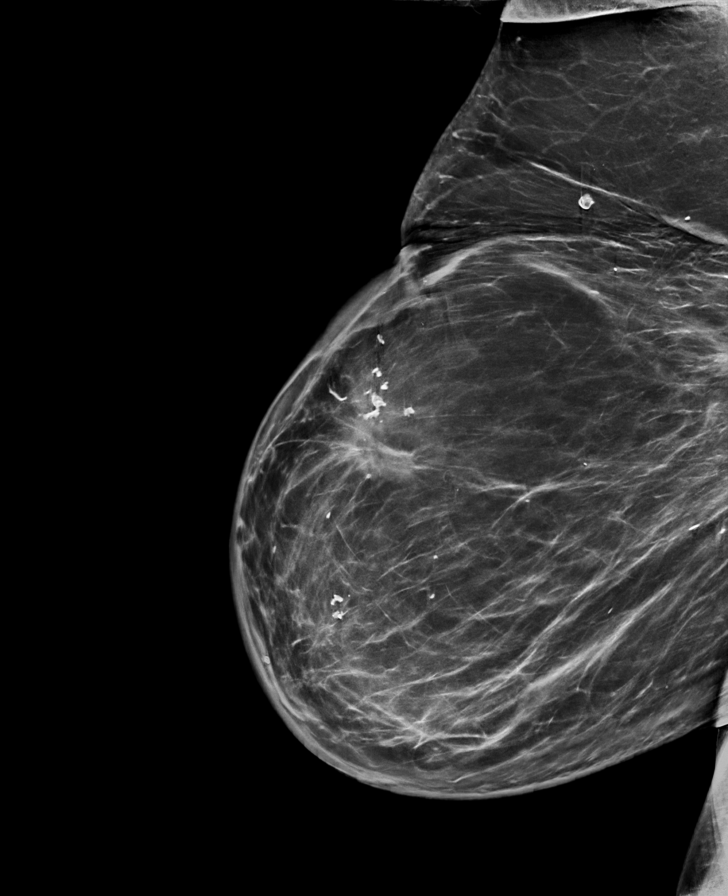

[L MLO synth-2D (1 of 2)]
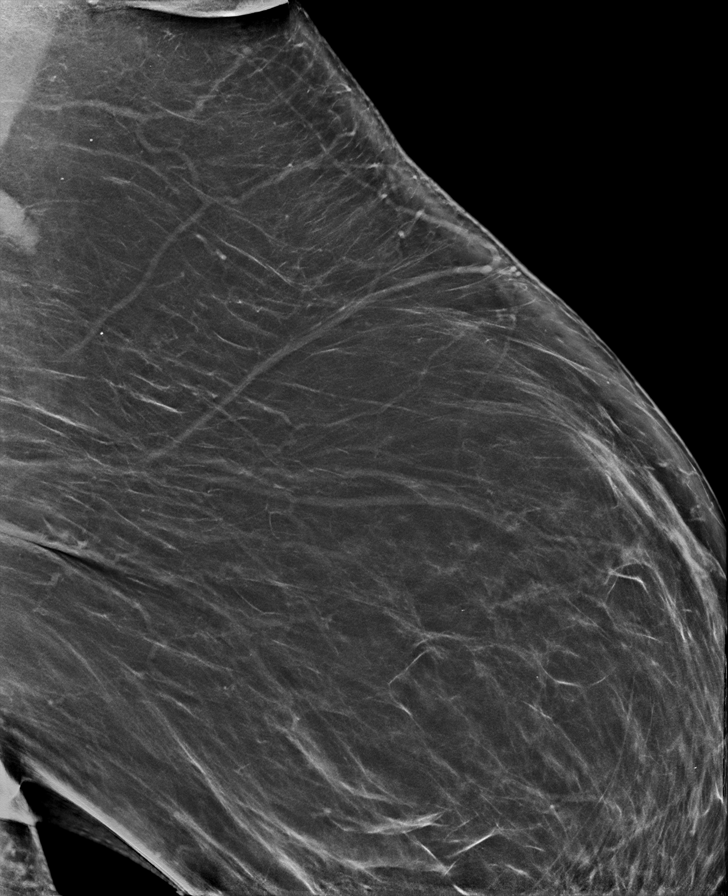

[L MLO synth-2D (2 of 2)]
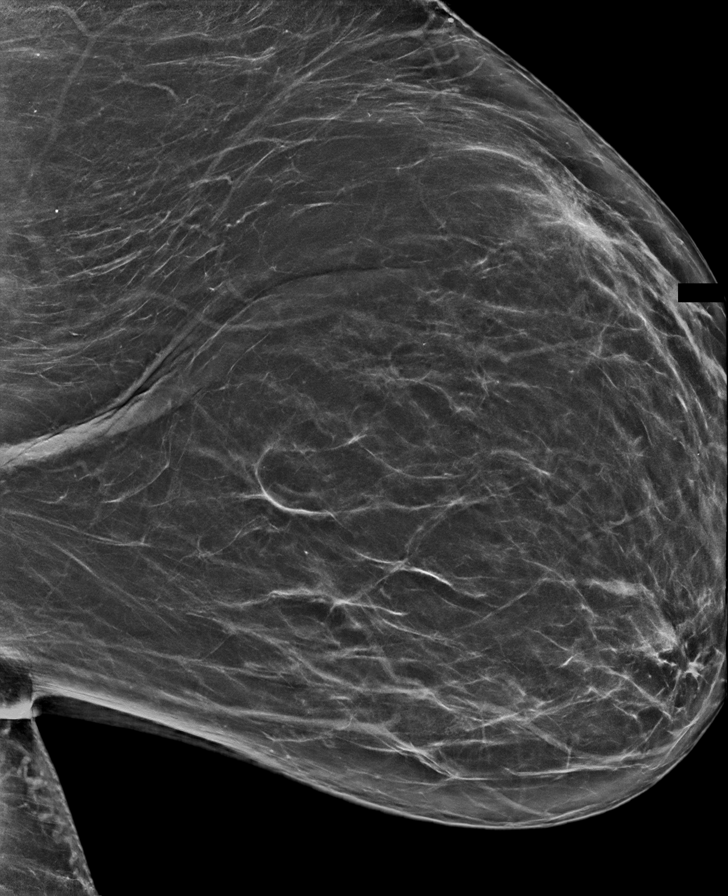

[L CC synth-2D (2 of 2)]
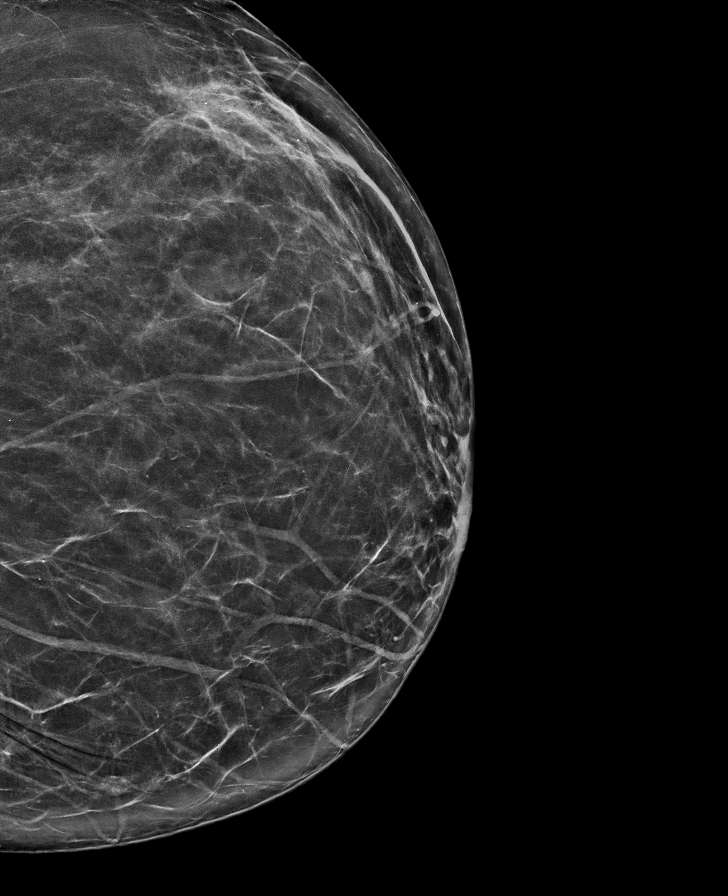

[L CC tomo · tomo slice 41/81.0]
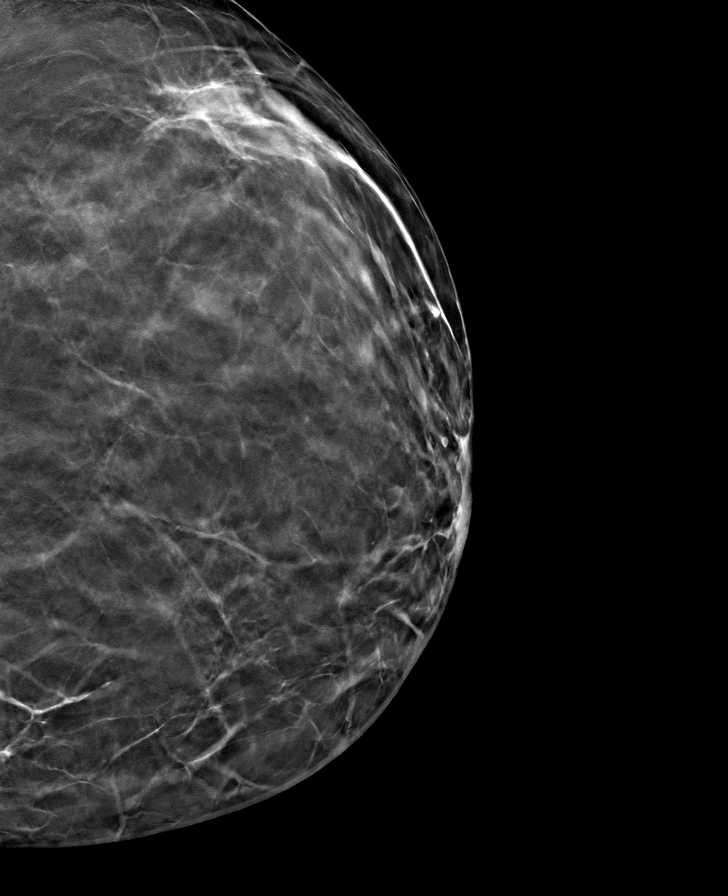

[8 of 40 positions shown; findings below may reference images not displayed]

ACR Breast Density Category b: There are scattered areas of
fibroglandular density.
FINDINGS: There are no findings suspicious for malignancy. There is density
and architectural distortion within the RIGHT breast, consistent
with postsurgical changes. These are stable in comparison to prior.
IMPRESSION: No mammographic evidence of malignancy. A result letter of this
screening mammogram will be mailed directly to the patient.

RECOMMENDATION:
Screening mammogram in one year. (Code:58-Z-4VR)

BI-RADS CATEGORY  2: Benign.

## 2022-07-21 ENCOUNTER — Other Ambulatory Visit: Payer: Self-pay | Admitting: Oncology

## 2022-07-25 ENCOUNTER — Other Ambulatory Visit: Payer: Self-pay

## 2022-07-25 MED ORDER — LETROZOLE 2.5 MG PO TABS: 2.5000 mg | ORAL_TABLET | Freq: Every day | ORAL | 3 refills | Status: AC

## 2022-07-25 MED ORDER — LETROZOLE 2.5 MG PO TABS: 2.5000 mg | ORAL_TABLET | Freq: Every day | ORAL | 1 refills | Status: DC

## 2022-08-24 ENCOUNTER — Inpatient Hospital Stay: Payer: Medicare Other | Attending: Oncology | Admitting: Oncology

## 2022-09-28 ENCOUNTER — Emergency Department: Payer: Medicare Other

## 2022-09-28 ENCOUNTER — Emergency Department
Admission: EM | Admit: 2022-09-28 | Discharge: 2022-09-28 | Disposition: A | Discharge status: Home / Self Care | Payer: Medicare Other | Attending: Emergency Medicine | Admitting: Emergency Medicine

## 2022-09-28 ENCOUNTER — Encounter: Payer: Self-pay | Admitting: Emergency Medicine

## 2022-09-28 ENCOUNTER — Other Ambulatory Visit: Payer: Self-pay

## 2022-09-28 DIAGNOSIS — I10 Essential (primary) hypertension: Secondary | ICD-10-CM | POA: Diagnosis not present

## 2022-09-28 DIAGNOSIS — Z79899 Other long term (current) drug therapy: Secondary | ICD-10-CM | POA: Diagnosis not present

## 2022-09-28 DIAGNOSIS — I160 Hypertensive urgency: Secondary | ICD-10-CM | POA: Diagnosis not present

## 2022-09-28 DIAGNOSIS — R519 Headache, unspecified: Secondary | ICD-10-CM | POA: Insufficient documentation

## 2022-09-28 DIAGNOSIS — M25512 Pain in left shoulder: Secondary | ICD-10-CM | POA: Diagnosis present

## 2022-09-28 LAB — CBC
HCT: 40.8 % (ref 36.0–46.0)
Hemoglobin: 12.9 g/dL (ref 12.0–15.0)
MCH: 25.6 pg — ABNORMAL LOW (ref 26.0–34.0)
MCHC: 31.6 g/dL (ref 30.0–36.0)
MCV: 81.1 fL (ref 80.0–100.0)
Platelets: 284 10*3/uL (ref 150–400)
RBC: 5.03 MIL/uL (ref 3.87–5.11)
RDW: 14.8 % (ref 11.5–15.5)
WBC: 6.9 10*3/uL (ref 4.0–10.5)
nRBC: 0 % (ref 0.0–0.2)

## 2022-09-28 LAB — BASIC METABOLIC PANEL
Anion gap: 8 (ref 5–15)
BUN: 14 mg/dL (ref 8–23)
CO2: 24 mmol/L (ref 22–32)
Calcium: 9.9 mg/dL (ref 8.9–10.3)
Chloride: 109 mmol/L (ref 98–111)
Creatinine, Ser: 0.62 mg/dL (ref 0.44–1.00)
GFR, Estimated: >60 mL/min (ref >60)
Glucose, Bld: 106 mg/dL — ABNORMAL HIGH (ref 70–99)
Potassium: 3.7 mmol/L (ref 3.5–5.1)
Sodium: 141 mmol/L (ref 135–145)

## 2022-09-28 LAB — TROPONIN I (HIGH SENSITIVITY)
Troponin I (High Sensitivity): 8 ng/L (ref <18)
Troponin I (High Sensitivity): 9 ng/L (ref <18)

## 2022-09-28 MED ORDER — CLONIDINE HCL 0.1 MG PO TABS
0.3000 mg | ORAL_TABLET | Freq: Once | ORAL | Status: AC
  Administered 2022-09-28: 0.3 mg via ORAL
  Filled 2022-09-28: qty 3

## 2022-09-28 NOTE — ED Triage Notes (Signed)
Patient reports being sent here due to hypertension. Patient denies any chest pain, shoulder pain, headache or shortness of breath. Patient mad and states she does not want to be here.

## 2022-09-28 NOTE — ED Triage Notes (Addendum)
EMS note: Pt here via [facility] with cp and shoulder pain. Pt called ems for htn. Hx of schizophrenia.   20G L hand 324 ASA given and 1 nitro  202/98

## 2022-09-28 NOTE — ED Notes (Signed)
This RN spoke with Legal Guardian, Candice. LG made aware pt being discharged and this RN unable to reach [facility].

## 2022-09-28 NOTE — ED Provider Notes (Signed)
[facility] Provider Note    Event Date/Time   First MD Initiated Contact with Patient 09/28/22 1359     (approximate)   History   Hypertension   HPI  Deborah Castillo is a 61 y.o. female with schizophrenia, known hypertension who comes into the emergency room due to elevated blood pressure.  Patient was given nitro and aspirin on route.  Patient reports that they stated that she had chest pain but she denies any chest pains.  She reports having pain in her left shoulder but has been going on for a while now.  She does report a mild headache but since it is now resolved with some Tylenol given at the facility.  She reports they have not given her to being on clonidine.  She denies any chest pain shortness of breath or other symptoms at this time.  On review of records patient was seen on 8/4 for hypertension as well and started on clonidine  Physical Exam   Triage Vital Signs: ED Triage Vitals  Enc Vitals Group     BP 09/28/22 1328 (!) 213/97     Pulse Rate 09/28/22 1324 (!) 57     Resp 09/28/22 1324 18     Temp 09/28/22 1324 98.4 F (36.9 C)     Temp Source 09/28/22 1324 Oral     SpO2 09/28/22 1324 94 %     Weight 09/28/22 1330 285 lb (129.3 kg)     Height 09/28/22 1330 '5\' 3"'$  (1.6 m)     Head Circumference --      Peak Flow --      Pain Score 09/28/22 1330 0     Pain Loc --      Pain Edu? --      Excl. in [place]? --     Most recent vital signs: Vitals:   09/28/22 1324 09/28/22 1328  BP:  (!) 213/97  Pulse: (!) 57   Resp: 18   Temp: 98.4 F (36.9 C)   SpO2: 94%      General: Awake, no distress.  CV:  Good peripheral perfusion.  Resp:  Normal effort.  Abd:  No distention.  Soft and nontender Other:  Cranial nerves appear intact.  Equal pulses radially and DPs. No Warmth or swelling noted of the left shoulder.  ED Results / Procedures / Treatments   Labs (all labs ordered are listed, but only abnormal results are displayed) Labs  Reviewed  CBC - Abnormal; Notable for the following components:      Result Value   MCH 25.6 (*)    All other components within normal limits  BASIC METABOLIC PANEL  TROPONIN I (HIGH SENSITIVITY)     EKG  My interpretation of EKG:  Normal sinus rhythm 61 without any ST elevation or T wave inversion noted in aVL with normal intervals  RADIOLOGY I have reviewed the xray personally and interpreted and no widened mediastinum or pneumonia  PROCEDURES:  Critical Care performed: No  Procedures   MEDICATIONS ORDERED IN ED: Medications - No data to display   IMPRESSION / MDM / [place] / ED COURSE  I reviewed the triage vital signs and the nursing notes.   Patient's presentation is most consistent with acute presentation with potential threat to life or bodily function.  Patient comes in hypertensive with known elevated blood pressures.  Given patient's not the best historian we will get CT head just to ensure no evidence of intracranial hemorrhage  case given her history of headaches as well as cardiac markers to evaluate for ACS, labs to evaluate for AKI.  Discussed with group home- BP was 205/210 and sent her out for the blood pressure- No falls and got tylenol for a mild headache. Shoulder pain is chronic for her.  Her Bps normally run 143/74 176/63.  Patient is currently on clonidine 0.33 times daily, furosemide 20 in the morning, losartan 100, metoprolol 100 twice daily, recently started on carvedilol 3.125 twice daily   Not sure why patient was started on carvedilol given already on metoprolol and this would not have much help with the blood pressure.  We will make a note for them to give to their primary care doctor.  Could consider adding hydralazine if blood pressures are remaining elevated but I suspect her blood pressure was elevated today given she was due for her clonidine right before that blood pressure was taken.  I did did instruct the facility that next  time they should give the clonidine and see what her blood pressures are doing if she is asymptomatic.  Of course that she is having symptoms and to please bring her in for evaluation.   CT head was negative.  Troponin is negative x2.  Consider admission but given blood pressure is downtrending to 173/106 and patient remains asymptomatic  when I re-assessed the patient- she feels comfortable with discharge back to facility        FINAL CLINICAL IMPRESSION(S) / ED DIAGNOSES   Final diagnoses:  Hypertensive urgency     Rx / DC Orders   ED Discharge Orders     None        Note:  This document was prepared using Dragon voice recognition software and may include unintentional dictation errors.   Vanessa [place], MD 09/28/22 [phone]

## 2022-09-28 NOTE — ED Notes (Signed)
Legal guardian to bedside to take pt home

## 2022-09-28 NOTE — Discharge Instructions (Addendum)
You should discuss with her primary care doctor about her blood pressure regimen.  She was recently started on carvedilol but this in combination with metoprolol could lower her heart rate too much and just her heart rate here was 50-60.  The carvedilol will also not have much help with the blood pressure.  I suspect that her blood pressure was elevated today given she was just about to be due for her clonidine.  We have given her her dose of clonidine here as she has remained asymptomatic and there is no evidence of heart attack brain bleed or other acute pathology.

## 2023-02-08 ENCOUNTER — Other Ambulatory Visit: Payer: Self-pay | Admitting: Nurse Practitioner

## 2023-02-08 DIAGNOSIS — Z1231 Encounter for screening mammogram for malignant neoplasm of breast: Secondary | ICD-10-CM

## 2023-03-04 ENCOUNTER — Emergency Department
Admission: EM | Admit: 2023-03-04 | Discharge: 2023-03-05 | Disposition: A | Discharge status: Home / Self Care | Payer: Medicare Other | Attending: Emergency Medicine | Admitting: Emergency Medicine

## 2023-03-04 ENCOUNTER — Emergency Department: Payer: Medicare Other

## 2023-03-04 ENCOUNTER — Other Ambulatory Visit: Payer: Self-pay

## 2023-03-04 DIAGNOSIS — R519 Headache, unspecified: Secondary | ICD-10-CM

## 2023-03-04 DIAGNOSIS — Z1152 Encounter for screening for COVID-19: Secondary | ICD-10-CM | POA: Diagnosis not present

## 2023-03-04 DIAGNOSIS — Z853 Personal history of malignant neoplasm of breast: Secondary | ICD-10-CM | POA: Diagnosis not present

## 2023-03-04 DIAGNOSIS — I10 Essential (primary) hypertension: Secondary | ICD-10-CM | POA: Insufficient documentation

## 2023-03-04 DIAGNOSIS — R1084 Generalized abdominal pain: Secondary | ICD-10-CM | POA: Diagnosis not present

## 2023-03-04 DIAGNOSIS — R112 Nausea with vomiting, unspecified: Secondary | ICD-10-CM

## 2023-03-04 DIAGNOSIS — K5900 Constipation, unspecified: Secondary | ICD-10-CM | POA: Diagnosis not present

## 2023-03-04 DIAGNOSIS — Z79899 Other long term (current) drug therapy: Secondary | ICD-10-CM | POA: Diagnosis not present

## 2023-03-04 DIAGNOSIS — J45909 Unspecified asthma, uncomplicated: Secondary | ICD-10-CM | POA: Insufficient documentation

## 2023-03-04 DIAGNOSIS — R197 Diarrhea, unspecified: Secondary | ICD-10-CM

## 2023-03-04 DIAGNOSIS — E119 Type 2 diabetes mellitus without complications: Secondary | ICD-10-CM | POA: Insufficient documentation

## 2023-03-04 LAB — COMPREHENSIVE METABOLIC PANEL
ALT: 16 U/L (ref 0–44)
AST: 24 U/L (ref 15–41)
Albumin: 4.3 g/dL (ref 3.5–5.0)
Alkaline Phosphatase: 76 U/L (ref 38–126)
Anion gap: 14 (ref 5–15)
BUN: 32 mg/dL — ABNORMAL HIGH (ref 8–23)
CO2: 24 mmol/L (ref 22–32)
Calcium: 9.9 mg/dL (ref 8.9–10.3)
Chloride: 96 mmol/L — ABNORMAL LOW (ref 98–111)
Creatinine, Ser: 1.23 mg/dL — ABNORMAL HIGH (ref 0.44–1.00)
GFR, Estimated: 49 mL/min — ABNORMAL LOW (ref >60)
Glucose, Bld: 135 mg/dL — ABNORMAL HIGH (ref 70–99)
Potassium: 3.7 mmol/L (ref 3.5–5.1)
Sodium: 134 mmol/L — ABNORMAL LOW (ref 135–145)
Total Bilirubin: 1.1 mg/dL (ref 0.3–1.2)
Total Protein: 8.3 g/dL — ABNORMAL HIGH (ref 6.5–8.1)

## 2023-03-04 LAB — LIPASE, BLOOD: Lipase: 31 U/L (ref 11–51)

## 2023-03-04 LAB — CBC
HCT: 38.4 % (ref 36.0–46.0)
Hemoglobin: 12.5 g/dL (ref 12.0–15.0)
MCH: 27 pg (ref 26.0–34.0)
MCHC: 32.6 g/dL (ref 30.0–36.0)
MCV: 82.9 fL (ref 80.0–100.0)
Platelets: 318 10*3/uL (ref 150–400)
RBC: 4.63 MIL/uL (ref 3.87–5.11)
RDW: 14.3 % (ref 11.5–15.5)
WBC: 10.4 10*3/uL (ref 4.0–10.5)
nRBC: 0 % (ref 0.0–0.2)

## 2023-03-04 LAB — TROPONIN I (HIGH SENSITIVITY): Troponin I (High Sensitivity): 6 ng/L (ref <18)

## 2023-03-04 MED ORDER — IOHEXOL 350 MG/ML SOLN
100.0000 mL | Freq: Once | INTRAVENOUS | Status: AC | PRN
  Administered 2023-03-04: 100 mL via INTRAVENOUS

## 2023-03-04 MED ORDER — SODIUM CHLORIDE 0.9 % IV BOLUS
1000.0000 mL | Freq: Once | INTRAVENOUS | Status: AC
  Administered 2023-03-05: 1000 mL via INTRAVENOUS

## 2023-03-04 MED ORDER — ONDANSETRON 4 MG PO TBDP
4.0000 mg | ORAL_TABLET | Freq: Once | ORAL | Status: AC | PRN
  Administered 2023-03-04: 4 mg via ORAL
  Filled 2023-03-04: qty 1

## 2023-03-04 NOTE — ED Triage Notes (Signed)
Pt BIB [facility] from [facility]. Pt presents with N/V and a HA that started this AM.

## 2023-03-04 NOTE — ED Triage Notes (Signed)
FIRST NURSE NOTE:  Pt arrived via [facility] from [facility], c/o HA n/V that started this AM, cold sxs x 1 week, did have 1 episode of vomiting with EMS reports relief of nausea after vomiting.  Cbg = 129 99.0 temp VSS with EMS Ao x 4

## 2023-03-04 NOTE — ED Provider Notes (Signed)
[facility] Provider Note    Event Date/Time   First MD Initiated Contact with Patient 03/04/23 2310     (approximate)   History   Emesis   HPI  Level V caveat: limited by MR  Deborah Castillo is a 62 y.o. female  brought to the ED via EMS from [facility] with a chief complaint of nausea/vomiting, global headache, cold-like symptoms for 1 week.  Patient also endorses diarrhea.  Denies fever, chest pain, shortness of breath, dysuria.   Past Medical History   Past Medical History:  Diagnosis Date   Asthma    B12 deficiency    Breast cancer (HCC) 2014   with radiation   Cataract    Diabetes mellitus without complication (HCC)    Family history of iron deficiency anemia    Hypertension    Mental retardation    Mixed hyperlipidemia    Personal history of radiation therapy    [facility]      Active Problem List   Patient Active Problem List   Diagnosis Date Noted   Hyperlipidemia due to type 2 diabetes mellitus (HCC) 02/21/2022   Asthma in adult 07/25/2017   Cataract 07/25/2017   Onychomycosis due to dermatophyte 07/25/2017   Diabetes mellitus type 2, uncontrolled 07/25/2017   H/O iron deficiency anemia 07/25/2017   Hypertension 07/25/2017   Malignant neoplasm of upper-outer quadrant of right female breast (HCC) 07/25/2017   Mental retardation 07/25/2017   Mixed hyperlipidemia 07/25/2017   Personal history of breast cancer 07/25/2017   Schizophrenia (HCC) 07/25/2017   Type 2 diabetes mellitus with complication (HCC) 07/25/2017   Vitamin B12 deficiency 07/25/2017   Morbid obesity with BMI of 50.0-59.9, adult (HCC) 02/21/2017   Malignant neoplasm of female breast (HCC) 05/24/2015     Past Surgical History   Past Surgical History:  Procedure Laterality Date   ABDOMINAL HYSTERECTOMY     BREAST BIOPSY Right 2014   positive   BREAST LUMPECTOMY Right 2014   INVASIVE MAMMARY CARCINOMA     Home Medications    Prior to Admission medications   Medication Sig Start Date End Date Taking? Authorizing Provider  Acetaminophen 500 MG coapsule Take by mouth.    [provider]  Azelastine-Fluticasone 137-50 MCG/ACT SUSP Place into the nose. 08/25/21   [provider]  canagliflozin (INVOKANA) 100 MG TABS tablet Take by mouth. Patient not taking: Reported on 02/21/2022 12/27/16   [provider]  cloNIDine (CATAPRES) 0.1 MG tablet Take 1 tablet (0.1 mg total) by mouth 3 (three) times daily for 15 days. 06/02/22 06/17/22  Menshew, Charlesetta Ivory, PA-C  cloNIDine (CATAPRES) 0.1 MG tablet Take 0.1 mg by mouth 3 (three) times daily.    [provider]  cyanocobalamin 1000 MCG tablet Take 1 tablet by mouth daily. 02/11/21   [provider]  diclofenac Sodium (VOLTAREN) 1 % GEL Apply topically. 02/07/22   [provider]  docusate sodium (COLACE) 100 MG capsule Take by mouth. Patient not taking: Reported on 02/21/2022    [provider]  fluPHENAZine (PROLIXIN) 1 MG tablet  07/14/15   [provider]  fluPHENAZine (PROLIXIN) 2.5 MG tablet Take 2.5 mg by mouth at bedtime. 11/28/21   [provider]  furosemide (LASIX) 20 MG tablet  04/14/15   [provider]  gabapentin (NEURONTIN) 100 MG capsule Take by mouth. 01/21/22   [provider]  glimepiride (AMARYL) 4 MG tablet Take by mouth. 11/02/16  [provider]  ketoconazole (NIZORAL) 2 % cream SMARTSIG:1 Application Topical 1 to 2 Times Daily 02/13/22   [provider]  ketorolac (ACULAR) 0.5 % ophthalmic solution  01/13/16   [provider]  letrozole (FEMARA) 2.5 MG tablet Take 1 tablet (2.5 mg total) by mouth daily. 07/25/22   Jeralyn Ruths, MD  linagliptin (TRADJENTA) 5 MG TABS tablet TAKE 1 TABLET BY MOUTH EVERY DAY. Patient not taking: Reported on 02/21/2022 12/01/16   [provider]  LORazepam (ATIVAN) 0.5 MG tablet Take 0.5 mg by  mouth 2 (two) times daily as needed. 01/23/22   [provider]  losartan (COZAAR) 100 MG tablet Take 100 mg by mouth 2 (two) times daily.    [provider]  meloxicam (MOBIC) 7.5 MG tablet  07/31/18   [provider]  metFORMIN (GLUCOPHAGE) 1000 MG tablet  04/14/15   [provider]  metFORMIN (GLUCOPHAGE-XR) 500 MG 24 hr tablet Take 500 mg by mouth 2 (two) times daily. Patient not taking: Reported on 02/21/2022 01/21/22   [provider]  metoprolol succinate (TOPROL-XL) 100 MG 24 hr tablet Take 100 mg by mouth 2 (two) times daily. 02/10/22   [provider]     Allergies  Patient has no known allergies.   Family History   Family History  Problem Relation Age of Onset   Breast cancer Neg Hx      Physical Exam  Triage Vital Signs: ED Triage Vitals  Enc Vitals Group     BP 03/04/23 1921 (!) 147/89     Pulse Rate 03/04/23 1921 93     Resp 03/04/23 1921 20     Temp 03/04/23 1921 99.1 F (37.3 C)     Temp Source 03/04/23 1921 Oral     SpO2 03/04/23 1921 97 %     Weight 03/04/23 1922 288 lb (130.6 kg)     Height 03/04/23 1922 5\' 3"  (1.6 m)     Head Circumference --      Peak Flow --      Pain Score 03/04/23 1922 0     Pain Loc --      Pain Edu? --      Excl. in GC? --     Updated Vital Signs: BP (!) 167/95   Pulse 94   Temp 99.1 F (37.3 C) (Oral)   Resp 19   Ht 5\' 3"  (1.6 m)   Wt 130.6 kg   SpO2 96%   BMI 51.02 kg/m    General: Awake, mild distress.  CV:  RRR.  Good peripheral perfusion. Resp:  Normal effort.  CTAB. Abd:  Nontender.  No distention.  Other:  No truncal vesicles.   ED Results / Procedures / Treatments  Labs (all labs ordered are listed, but only abnormal results are displayed) Labs Reviewed  COMPREHENSIVE METABOLIC PANEL - Abnormal; Notable for the following components:      Result Value   Sodium 134 (*)    Chloride 96 (*)    Glucose, Bld 135 (*)    BUN 32 (*)    Creatinine, Ser 1.23  (*)    Total Protein 8.3 (*)    GFR, Estimated 49 (*)    All other components within normal limits  URINALYSIS, ROUTINE W REFLEX MICROSCOPIC - Abnormal; Notable for the following components:   Color, Urine STRAW (*)    APPearance CLEAR (*)    Glucose, UA >=500 (*)    All other components within normal  limits  SARS CORONAVIRUS 2 BY RT PCR  LIPASE, BLOOD  CBC  TROPONIN I (HIGH SENSITIVITY)     EKG  None   RADIOLOGY I have independently visualized and interpreted patient's x-ray and CT scans as well as noted the radiology interpretation:  Chest x-ray: Cardiomegaly, no acute  CT head: No ICH  CT abdomen/Pelvis: Constipation, no acute abnormality   Official radiology report(s): CT ABDOMEN PELVIS W CONTRAST  Result Date: 03/05/2023 CLINICAL DATA:  Acute abdominal pain. EXAM: CT ABDOMEN AND PELVIS WITH CONTRAST TECHNIQUE: Multidetector CT imaging of the abdomen and pelvis was performed using the standard protocol following bolus administration of intravenous contrast. RADIATION DOSE REDUCTION: This exam was performed according to the departmental dose-optimization program which includes automated exposure control, adjustment of the mA and/or kV according to patient size and/or use of iterative reconstruction technique. CONTRAST:  OMNIPAQUE IOHEXOL 350 MG/ML SOLN COMPARISON:  None Available. FINDINGS: Lower chest: Lung bases are clear. There is asymmetric elevation and eventration of the anterior right diaphragm. There is mild panchamber cardiomegaly. No pericardial effusion or visible coronary plaques. Scattered calcification in the aortic annulus. The pulmonary trunk is prominent measuring 3.3 cm indicating arterial hypertension. Visualized portions of the aorta are normal caliber. Hepatobiliary: Respiratory motion limits fine detail. The liver is mildly steatotic measuring 22 cm length. No mass is seen through the breathing motion. The gallbladder and bile ducts are unremarkable.  Pancreas: No mass is seen through the breathing motion. Spleen: No mass is seen through the breathing motion. Adrenals/Urinary Tract: There is no adrenal mass. There is a fat containing angiomyolipoma in the anterior superior right kidney measuring 2.7 x 2.2 x 2.9 cm, Hounsfield density of -34. No other renal masses are seen through the breathing motion. There is no urinary stone or obstruction. There is no bladder thickening. Stomach/Bowel: No dilatation, wall thickening or inflammatory changes including the appendix. There is mild-to-moderate fecal stasis. Scattered sigmoid diverticula without diverticulitis. Vascular/Lymphatic: Trace aortic calcific plaque. No AAA. There are no enlarged lymph nodes. Numerous pelvic phleboliths. Reproductive: Status post hysterectomy. No adnexal masses. Other: There is no free air, free hemorrhage or free fluid. There is no incarcerated hernia. There are small bilateral inguinal fat hernias. Musculoskeletal: Mild osteopenia and degenerative change lumbar spine. No acute or significant osseous findings. IMPRESSION: 1. No acute CT findings in the abdomen or pelvis. 2. Constipation and diverticulosis. 3. 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 Follow-up as indicated. 4. Cardiomegaly with prominent pulmonary trunk indicating arterial hypertension. 5. Mild hepatic steatosis. 6. Trace abdominal aortic atherosclerosis. 7. Small bilateral inguinal fat hernias. 8. Osteopenia and degenerative change. Aortic Atherosclerosis (ICD10-I70.0). Electronically Signed   By: Almira Bar M.D.   On: 03/05/2023 00:39   CT Head Wo Contrast  Result Date: 03/05/2023 CLINICAL DATA:  Headache. EXAM: CT HEAD WITHOUT CONTRAST TECHNIQUE: Contiguous axial images were obtained from the base of the skull through the vertex without intravenous contrast. RADIATION DOSE REDUCTION: This exam was performed according to the departmental dose-optimization program which includes automated exposure control,  adjustment of the mA and/or kV according to patient size and/or use of iterative reconstruction technique. COMPARISON:  Head CT dated 09/28/2022. FINDINGS: Brain: Mild age-related atrophy and chronic microvascular ischemic changes. 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 Soft is no acute intracranial hemorrhage. No mass effect or midline shift no extra-axial fluid collection. Vascular: No hyperdense vessel or unexpected calcification. Skull: Normal. Negative for fracture or focal lesion.  Sinuses/Orbits: No acute finding. Other: None IMPRESSION: 1. No acute intracranial pathology. 2. Mild age-related atrophy and chronic microvascular ischemic changes. 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 Electronically Signed   By: Elgie Collard M.D.   On: 03/05/2023 00:28   DG Chest Port 1 View  Result Date: 03/05/2023 CLINICAL DATA:  Headache, nausea and vomiting. EXAM: PORTABLE CHEST 1 VIEW COMPARISON:  PA Lat chest 09/28/2022 FINDINGS: There is cardiomegaly without evidence of CHF or pleural effusions. Right hemidiaphragm is again elevated the mid hilar level and partially obscuring the right base. The visualized lungs are clear. The sulci are sharp. The mediastinum is normally outlined. There are overlying monitor wires.  Thoracic cage is intact. IMPRESSION: 1. Cardiomegaly without evidence of CHF or pneumonia. 2. Chronic elevation of the right hemidiaphragm. Electronically Signed   By: Almira Bar M.D.   On: 03/05/2023 00:10     PROCEDURES:  Critical Care performed: No  Procedures   MEDICATIONS ORDERED IN ED: Medications  ondansetron (ZOFRAN-ODT) disintegrating tablet 4 mg (4 mg Oral Given 03/04/23 1927)  sodium chloride 0.9 % bolus 1,000 mL (1,000 mLs Intravenous New Bag/Given 03/05/23 0019)  iohexol (OMNIPAQUE) 350 MG/ML injection 100 mL (100 mLs Intravenous Contrast Given 03/04/23 2359)  ondansetron  (ZOFRAN-ODT) disintegrating tablet 4 mg (4 mg Oral Given 03/05/23 0019)     IMPRESSION / MDM / ASSESSMENT AND PLAN / ED COURSE  I reviewed the triage vital signs and the nursing notes.                             62 year old female presenting with abdominal pain, nausea/vomiting, headache. Differential diagnosis includes, but is not limited to, ovarian cyst, ovarian torsion, acute appendicitis, diverticulitis, urinary tract infection/pyelonephritis, endometriosis, bowel obstruction, colitis, renal colic, gastroenteritis, hernia, etc. I personally reviewed patient's records and note of last cardiology office visit on 01/19/2023 for hypertension.  Patient's presentation is most consistent with acute presentation with potential threat to life or bodily function.    Clinical Course as of 03/05/23 0153  Mon Mar 05, 2023  6213 Patient feeling significantly better.  No emesis while in the emergency department.  Will discharge back to facility with prescriptions for as needed lactulose and Zofran.  Strict return precautions given.  Patient verbalizes understanding and agrees with plan of care. [JS]    Clinical Course User Index [JS] Irean Hong, MD     FINAL CLINICAL IMPRESSION(S) / ED DIAGNOSES   Final diagnoses:  Nausea vomiting and diarrhea  Generalized abdominal pain  Acute nonintractable headache, unspecified headache type  Constipation, unspecified constipation type     Rx / DC Orders   ED Discharge Orders     None        Note:  This document was prepared using Dragon voice recognition software and may include unintentional dictation errors.   Irean Hong, MD 03/05/23 [phone]

## 2023-03-05 ENCOUNTER — Emergency Department: Payer: Medicare Other

## 2023-03-05 DIAGNOSIS — R112 Nausea with vomiting, unspecified: Secondary | ICD-10-CM | POA: Diagnosis not present

## 2023-03-05 LAB — URINALYSIS, ROUTINE W REFLEX MICROSCOPIC
Bacteria, UA: NONE SEEN
Bilirubin Urine: NEGATIVE
Glucose, UA: >=500 mg/dL — AB
Hgb urine dipstick: NEGATIVE
Ketones, ur: NEGATIVE mg/dL
Leukocytes,Ua: NEGATIVE
Nitrite: NEGATIVE
Protein, ur: NEGATIVE mg/dL
Specific Gravity, Urine: 1.016 (ref 1.005–1.030)
pH: 6 (ref 5.0–8.0)

## 2023-03-05 LAB — SARS CORONAVIRUS 2 BY RT PCR: SARS Coronavirus 2 by RT PCR: NEGATIVE

## 2023-03-05 MED ORDER — LACTULOSE 10 GM/15ML PO SOLN: 20.0000 g | Freq: Every day | ORAL | 0 refills | Status: AC | PRN

## 2023-03-05 MED ORDER — ONDANSETRON 4 MG PO TBDP
4.0000 mg | ORAL_TABLET | Freq: Once | ORAL | Status: AC | PRN
  Administered 2023-03-05: 4 mg via ORAL
  Filled 2023-03-05: qty 1

## 2023-03-05 MED ORDER — ONDANSETRON 4 MG PO TBDP: 4.0000 mg | ORAL_TABLET | Freq: Three times a day (TID) | ORAL | 0 refills | Status: DC | PRN

## 2023-03-05 NOTE — ED Notes (Signed)
Attempted to call report to [facility] [phone], no answer.

## 2023-03-05 NOTE — ED Notes (Signed)
Called and spoke with [employer], clarence, who reports he can be here to pick up pt at 8am.

## 2023-03-05 NOTE — ED Notes (Addendum)
[place] legal guardianship supervisor: [place] # [phone]   [phone]

## 2023-03-05 NOTE — ED Notes (Signed)
Spoke with Kylie with DSS who gave administrator of [facility] # [phone]. Phone call went to voicemail, will attempt to make further calls.

## 2023-03-05 NOTE — Discharge Instructions (Signed)
You may take Lactulose as needed for bowel movements.  Take Zofran as needed for nausea/vomiting.  Clear liquid diet x 12 hours, then slowly advance diet as tolerated.  Return to the ER for worsening symptoms, persistent vomiting, difficulty breathing or other concerns.

## 2023-03-05 NOTE — ED Notes (Signed)
Patient informed that her group home will be picking her up at 8:00 this morning, pt voiced understanding. Pt offered something to drink and declined. Pt states that she is just ready to go home.

## 2023-03-23 ENCOUNTER — Ambulatory Visit
Admission: RE | Admit: 2023-03-23 | Discharge: 2023-03-23 | Disposition: A | Discharge status: Home / Self Care | Payer: Medicare Other | Source: Ambulatory Visit | Attending: Nurse Practitioner | Admitting: Nurse Practitioner

## 2023-03-23 DIAGNOSIS — Z1231 Encounter for screening mammogram for malignant neoplasm of breast: Secondary | ICD-10-CM | POA: Diagnosis present

## 2023-03-27 ENCOUNTER — Encounter: Payer: Self-pay | Admitting: Family Medicine

## 2023-04-09 ENCOUNTER — Other Ambulatory Visit: Payer: Self-pay | Admitting: Nurse Practitioner

## 2023-04-09 DIAGNOSIS — R928 Other abnormal and inconclusive findings on diagnostic imaging of breast: Secondary | ICD-10-CM

## 2023-04-09 DIAGNOSIS — R921 Mammographic calcification found on diagnostic imaging of breast: Secondary | ICD-10-CM

## 2023-04-11 ENCOUNTER — Ambulatory Visit
Admission: RE | Admit: 2023-04-11 | Discharge: 2023-04-11 | Disposition: A | Discharge status: Home / Self Care | Payer: Medicare Other | Source: Ambulatory Visit | Attending: Nurse Practitioner | Admitting: Nurse Practitioner

## 2023-04-11 ENCOUNTER — Encounter: Payer: Self-pay | Admitting: Nurse Practitioner

## 2023-04-11 DIAGNOSIS — R928 Other abnormal and inconclusive findings on diagnostic imaging of breast: Secondary | ICD-10-CM | POA: Diagnosis present

## 2023-04-11 DIAGNOSIS — R921 Mammographic calcification found on diagnostic imaging of breast: Secondary | ICD-10-CM | POA: Insufficient documentation

## 2023-04-12 ENCOUNTER — Encounter: Payer: Self-pay | Admitting: Nurse Practitioner

## 2023-04-16 ENCOUNTER — Other Ambulatory Visit: Payer: Self-pay | Admitting: Nurse Practitioner

## 2023-04-16 DIAGNOSIS — R921 Mammographic calcification found on diagnostic imaging of breast: Secondary | ICD-10-CM

## 2023-04-16 DIAGNOSIS — R928 Other abnormal and inconclusive findings on diagnostic imaging of breast: Secondary | ICD-10-CM

## 2023-04-25 ENCOUNTER — Ambulatory Visit
Admission: RE | Admit: 2023-04-25 | Discharge: 2023-04-25 | Disposition: A | Discharge status: Home / Self Care | Payer: Medicare Other | Source: Ambulatory Visit | Attending: Nurse Practitioner | Admitting: Nurse Practitioner

## 2023-04-25 DIAGNOSIS — R921 Mammographic calcification found on diagnostic imaging of breast: Secondary | ICD-10-CM | POA: Diagnosis present

## 2023-04-25 DIAGNOSIS — R928 Other abnormal and inconclusive findings on diagnostic imaging of breast: Secondary | ICD-10-CM

## 2023-04-25 HISTORY — PX: BREAST BIOPSY: SHX20

## 2023-04-25 MED ORDER — LIDOCAINE 1 % OPTIME INJ - NO CHARGE
5.0000 mL | Freq: Once | INTRAMUSCULAR | Status: AC
  Administered 2023-04-25: 5 mL
  Filled 2023-04-25: qty 6

## 2023-04-25 MED ORDER — LIDOCAINE-EPINEPHRINE 1 %-1:100000 IJ SOLN
20.0000 mL | Freq: Once | INTRAMUSCULAR | Status: AC
  Administered 2023-04-25: 20 mL
  Filled 2023-04-25: qty 20

## 2023-04-26 ENCOUNTER — Encounter: Payer: Self-pay | Admitting: *Deleted

## 2023-04-26 NOTE — Progress Notes (Signed)
Received referral for newly diagnosed breast cancer from [facility].  Navigation initiated.  Left message with Social worker [name] asking for return call to discuss surgical referral.   Deborah Castillo is previous patient of Dr. Orlie Dakin, she will see him on July 2nd at 8:45.

## 2023-04-26 NOTE — Progress Notes (Signed)
Spoke with [name] the Child psychotherapist.   She will see Dr. Orlie Dakin on Tuesday, they will discuss which surgeon to see with Dr. Orlie Dakin when she comes in.

## 2023-05-01 ENCOUNTER — Encounter: Payer: Self-pay | Admitting: *Deleted

## 2023-05-01 ENCOUNTER — Inpatient Hospital Stay: Payer: Medicare Other | Attending: Oncology | Admitting: Oncology

## 2023-05-01 ENCOUNTER — Encounter: Payer: Self-pay | Admitting: Oncology

## 2023-05-01 VITALS — BP 143/85 | HR 77 | Temp 97.9°F | Resp 18 | Ht 63.0 in | Wt 282.0 lb

## 2023-05-01 DIAGNOSIS — D0511 Intraductal carcinoma in situ of right breast: Secondary | ICD-10-CM | POA: Insufficient documentation

## 2023-05-01 DIAGNOSIS — D0512 Intraductal carcinoma in situ of left breast: Secondary | ICD-10-CM | POA: Diagnosis not present

## 2023-05-01 DIAGNOSIS — Z170 Estrogen receptor positive status [ER+]: Secondary | ICD-10-CM | POA: Diagnosis not present

## 2023-05-01 DIAGNOSIS — Z923 Personal history of irradiation: Secondary | ICD-10-CM | POA: Insufficient documentation

## 2023-05-01 DIAGNOSIS — Z79811 Long term (current) use of aromatase inhibitors: Secondary | ICD-10-CM | POA: Diagnosis not present

## 2023-05-01 DIAGNOSIS — Z171 Estrogen receptor negative status [ER-]: Secondary | ICD-10-CM | POA: Insufficient documentation

## 2023-05-01 DIAGNOSIS — C50211 Malignant neoplasm of upper-inner quadrant of right female breast: Secondary | ICD-10-CM

## 2023-05-01 DIAGNOSIS — C50912 Malignant neoplasm of unspecified site of left female breast: Secondary | ICD-10-CM | POA: Insufficient documentation

## 2023-05-01 NOTE — Progress Notes (Signed)
Met with patient and Child psychotherapist during visit with Dr. Orlie Dakin.

## 2023-05-01 NOTE — Progress Notes (Signed)
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  INTERVAL HISTORY: Patient returns to clinic today for further evaluation, discussion of her biopsy results, and treatment planning.  She now lives in a group home and has a legal guardian.  She currently feels well.  She continues to tolerate letrozole without significant side effects.  She has no neurologic complaints.  She denies any recent fevers or illnesses.  She has a good appetite.  She denies any chest pain, shortness of breath, cough, or hemoptysis.  She denies any nausea, vomiting, constipation, or diarrhea.  She has no urinary complaints.  Patient offers no further specific complaints today.  REVIEW OF SYSTEMS:   Review of Systems  Constitutional: Negative.  Negative for fever, malaise/fatigue and weight loss.  HENT:  Negative for sore throat.   Respiratory: Negative.  Negative for cough and shortness of breath.   Cardiovascular: Negative.  Negative for chest pain and leg swelling.  Gastrointestinal: Negative.  Negative for abdominal pain.  Genitourinary: Negative.  Negative for flank pain.  Musculoskeletal: Negative.  Negative for back pain.  Skin: Negative.  Negative for rash.  Neurological: Negative.  Negative for dizziness, focal weakness, weakness and headaches.  Psychiatric/Behavioral: Negative.  The patient is not nervous/anxious.     As per HPI. Otherwise, a complete review of systems is negative.  PAST MEDICAL HISTORY: Past Medical History:  Diagnosis Date   Asthma    B12 deficiency    Breast cancer (HCC) 2014   with radiation   Cataract    Diabetes mellitus without  complication (HCC)    Family history of iron deficiency anemia    Hypertension    Mental retardation    Mixed hyperlipidemia    Personal history of radiation therapy    Schizophrenia (HCC)     PAST SURGICAL HISTORY: Past Surgical History:  Procedure Laterality Date   ABDOMINAL HYSTERECTOMY     BREAST BIOPSY Right 2014   positive   BREAST BIOPSY Left 04/25/2023   Left Breast Stereo Bx, coil clip - path pending   BREAST BIOPSY Left 04/25/2023   MM LT BREAST BX W LOC DEV 1ST LESION IMAGE BX SPEC STEREO GUIDE 04/25/2023 [facility]   BREAST LUMPECTOMY Right 2014   INVASIVE MAMMARY CARCINOMA    FAMILY HISTORY:  Diabetes and hypertension.     ADVANCED DIRECTIVES:    HEALTH MAINTENANCE: Social History   Tobacco Use   Smoking status: Never   Smokeless tobacco: Never  Vaping Use   Vaping Use: Never used  Substance Use Topics   Alcohol use: No   Drug use: No     Colonoscopy:  PAP:  Bone density:  Lipid panel:  No Known Allergies  Current Outpatient Medications  Medication Sig Dispense Refill   Acetaminophen 500 MG coapsule Take by mouth.     amLODipine (NORVASC) 10 MG tablet Take 10 mg by mouth daily.     atorvastatin (LIPITOR) 20 MG tablet Take 20 mg by mouth daily.     empagliflozin (JARDIANCE) 10 MG TABS tablet Take by mouth daily.     fluPHENAZine (PROLIXIN) 1 MG tablet      fluPHENAZine (PROLIXIN) 2.5 MG tablet Take 2.5  mg by mouth at bedtime.     furosemide (LASIX) 20 MG tablet      gabapentin (NEURONTIN) 100 MG capsule Take by mouth.     glimepiride (AMARYL) 4 MG tablet Take by mouth.     hydrochlorothiazide (HYDRODIURIL) 25 MG tablet Take 25 mg by mouth daily.     isosorbide mononitrate (IMDUR) 120 MG 24 hr tablet Take 120 mg by mouth daily.     ketoconazole (NIZORAL) 2 % cream SMARTSIG:1 Application Topical 1 to 2 Times Daily     ketorolac (ACULAR) 0.5 % ophthalmic solution      lactulose (CHRONULAC) 10 GM/15ML solution Take 30 mLs (20 g total) by  mouth daily as needed for mild constipation. 120 mL 0   letrozole (FEMARA) 2.5 MG tablet Take 1 tablet (2.5 mg total) by mouth daily. 90 tablet 3   LORazepam (ATIVAN) 0.5 MG tablet Take 0.5 mg by mouth 2 (two) times daily as needed.     losartan (COZAAR) 100 MG tablet Take 100 mg by mouth 2 (two) times daily.     metoprolol tartrate (LOPRESSOR) 100 MG tablet Take 100 mg by mouth 2 (two) times daily.     pioglitazone (ACTOS) 15 MG tablet Take 15 mg by mouth daily.     Azelastine-Fluticasone 137-50 MCG/ACT SUSP Place into the nose.     canagliflozin (INVOKANA) 100 MG TABS tablet Take by mouth. (Patient not taking: Reported on 02/21/2022)     cloNIDine (CATAPRES) 0.1 MG tablet Take 1 tablet (0.1 mg total) by mouth 3 (three) times daily for 15 days. 45 tablet 0   cloNIDine (CATAPRES) 0.1 MG tablet Take 0.1 mg by mouth 3 (three) times daily.     cyanocobalamin 1000 MCG tablet Take 1 tablet by mouth daily.     diclofenac Sodium (VOLTAREN) 1 % GEL Apply topically.     docusate sodium (COLACE) 100 MG capsule Take by mouth. (Patient not taking: Reported on 02/21/2022)     linagliptin (TRADJENTA) 5 MG TABS tablet      meloxicam (MOBIC) 7.5 MG tablet  (Patient not taking: Reported on 02/21/2022)     metFORMIN (GLUCOPHAGE) 1000 MG tablet      metFORMIN (GLUCOPHAGE-XR) 500 MG 24 hr tablet Take 500 mg by mouth 2 (two) times daily. (Patient not taking: Reported on 02/21/2022)     ondansetron (ZOFRAN-ODT) 4 MG disintegrating tablet Take 1 tablet (4 mg total) by mouth every 8 (eight) hours as needed for nausea or vomiting. 20 tablet 0   No current facility-administered medications for this visit.    OBJECTIVE: Vitals:   05/01/23 0835  BP: (!) 143/85  Pulse: 77  Resp: 18  Temp: 97.9 F (36.6 C)  SpO2: 100%     Body mass index is 49.95 kg/m.    ECOG FS:0 - Asymptomatic  General: Well-developed, well-nourished, no acute distress. Eyes: Pink conjunctiva, anicteric sclera. HEENT: Normocephalic, moist  mucous membranes. Breasts: Exam deferred today. Lungs: No audible wheezing or coughing. Heart: Regular rate and rhythm. Abdomen: Soft, nontender, no obvious distention. Musculoskeletal: No edema, cyanosis, or clubbing. Neuro: Alert, answering all questions appropriately. Cranial nerves grossly intact. Skin: No rashes or petechiae noted. Psych: Normal affect.   LAB RESULTS:  Lab Results  Component Value Date   NA 134 (L) 03/04/2023   K 3.7 03/04/2023   CL 96 (L) 03/04/2023   CO2 24 03/04/2023   GLUCOSE 135 (H) 03/04/2023   BUN 32 (H) 03/04/2023   CREATININE 1.23 (H) 03/04/2023  CALCIUM 9.9 03/04/2023   PROT 8.3 (H) 03/04/2023   ALBUMIN 4.3 03/04/2023   AST 24 03/04/2023   ALT 16 03/04/2023   ALKPHOS 76 03/04/2023   BILITOT 1.1 03/04/2023   GFRNONAA 49 (L) 03/04/2023   GFRAA >60 07/15/2013    Lab Results  Component Value Date   WBC 10.4 03/04/2023   NEUTROABS 4.6 06/02/2022   HGB 12.5 03/04/2023   HCT 38.4 03/04/2023   MCV 82.9 03/04/2023   PLT 318 03/04/2023   Lab Results  Component Value Date   LABCA2 24.0 12/06/2016     STUDIES: MM LT BREAST BX W LOC DEV 1ST LESION IMAGE BX SPEC STEREO GUIDE  Addendum Date: 04/27/2023   ADDENDUM REPORT: 04/27/2023 14:09 ADDENDUM: Pathology revealed HIGH-GRADE DUCTAL CARCINOMA IN SITU, SOLID TYPE WITH NECROSIS SHOWING CANCERIZATION OF LOBULES AND FOCAL MICROINVASION MICROCALCIFICATIONS PRESENT WITHIN DCIS AND ADJACENT BENIGN DUCTS AND ADENOSIS DCIS of the LEFT breast, inner central, (coil clip). This was found to be concordant byDr. Jacob Moores. Pathology results were discussed with the patient's social worker, Theodis Shove by telephone by Randa Lynn, RN Nurse Navigator, per request. The patient reported doing well after the biopsy with tenderness at the site. Post biopsy instructions and care were reviewed and questions were answered. The patient was encouraged to call [facility] for any  additional concerns. RECOMMENDATION: 1. Recommend surgical and oncology referrals. Consultation request was sent to Irving Shows, RN Nurse Navigator via secure EPIC message on April 26, 2023. Medical Oncology consultation has been arranged with Dr. Gerarda Fraction at [facility] on May 01, 2023. 2. If breast conservation therapy is being pursued, recommend bracket localization of the residual microcalcifications, which span approximately 2.5 cm. Pathology results reported by Rene Kocher, RN on 04/27/2023. Electronically Signed   By: Jacob Moores M.D.   On: 04/27/2023 14:09   Result Date: 04/27/2023 CLINICAL DATA:  62 year old female with screen detected indeterminate calcifications in the left breast EXAM: LEFT BREAST STEREOTACTIC CORE NEEDLE BIOPSY COMPARISON:  Previous exam(s). FINDINGS: The patient and I discussed the procedure of stereotactic-guided biopsy including benefits and alternatives. We discussed the high likelihood of a successful procedure. We discussed the risks of the procedure including infection, bleeding, tissue injury, clip migration, and inadequate sampling. Informed written consent was given. The usual time out protocol was performed immediately prior to the procedure. Using sterile technique and 1% Lidocaine as local anesthetic, under stereotactic guidance, a 9 gauge vacuum assisted device was used to perform core needle biopsy of calcifications in the inner central left using a superior approach. Specimen radiograph was performed showing representative calcifications. Specimens with calcifications are identified for pathology. Lesion quadrant: Upper inner quadrant At the conclusion of the procedure, a coil shaped tissue marker clip was deployed into the biopsy cavity. Follow-up 2-view mammogram was performed and dictated separately. IMPRESSION: Stereotactic-guided biopsy of calcifications in the inner central left breast. No apparent complications.  Electronically Signed: By: Jacob Moores M.D. On: 04/25/2023 11:23   MM CLIP PLACEMENT LEFT  Result Date: 04/25/2023 CLINICAL DATA:  Post-procedure mammogram EXAM: 3D DIAGNOSTIC LEFT MAMMOGRAM POST STEREOTACTIC BIOPSY COMPARISON:  Previous exam(s). FINDINGS: 3D Mammographic images were obtained following stereotactic guided biopsy of calcifications in the inner central left breast. The coil shaped biopsy marking clip is approximately 15 mm laterally displaced from the site of biopsy. There are 10 mm of residual microcalcifications that are located 20 mm medial to the biopsy marking clip. IMPRESSION:  Approximately 15 mm lateral displacement of the coil shaped biopsy marking clip from the site of biopsy in the inner central left breast. Residual microcalcifications are noted. Final Assessment: Post Procedure Mammograms for Marker Placement Electronically Signed   By: Jacob Moores M.D.   On: 04/25/2023 11:27  MM 3D DIAGNOSTIC MAMMOGRAM UNILATERAL LEFT BREAST  Result Date: 04/11/2023 CLINICAL DATA:  Callback for LEFT breast calcifications. Patient is developmentally delayed. History of RIGHT breast cancer status post radiation. EXAM: DIGITAL DIAGNOSTIC UNILATERAL LEFT MAMMOGRAM WITH TOMOSYNTHESIS TECHNIQUE: Left digital diagnostic mammography and breast tomosynthesis was performed. Best images possible per technologist communication. COMPARISON:  Previous exam(s). ACR Breast Density Category b: There are scattered areas of fibroglandular density. FINDINGS: Spot magnification views of the LEFT breast demonstrate loosely grouped coarse heterogeneous and punctate calcifications in a linear distribution lower inner breast at middle depth. These are not stable in comparison to prior mammograms. Dominant group spans approximately 2.5 cm but extends approximately 4.5 cm if inclusive of 2 adjacent faint punctate calcifications. Patient expressed limited tolerance of true lateral imaging. IMPRESSION: There are at  least 2.5 cm of indeterminate calcifications in the LEFT lower inner breast at middle depth. Recommend stereotactic guided biopsy for definitive characterization as per patient tolerance. RECOMMENDATION: LEFT breast stereotactic guided biopsy x1 I have discussed the findings and recommendations with the patient and patient's caretaker. The biopsy procedure was discussed with the patient and questions were answered. Patient expressed their understanding of the biopsy recommendation. Patient will be scheduled for biopsy at her earliest convenience by the schedulers. Ordering provider will be notified. If applicable, a reminder letter will be sent to the patient regarding the next appointment. BI-RADS CATEGORY  4: Suspicious. Electronically Signed   By: Meda Klinefelter M.D.   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  Although malignancy has a microinvasive component and is ER/PR negative, it is unlikely patient will tolerate adjuvant chemotherapy.  Given the patient's underlying schizophrenia, mastectomy without XRT may be a consideration.  Return to clinic 1 to 2 weeks after her surgery to discuss her final results. 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  Patient's new lesion is ER/PR negative, therefore she has been instructed to continue letrozole for a total of 10 years completing treatment in March 2025.  Bone health: Patient's most recent bone mineral density on Mar 16, 2021 reported T-score of -0.3 which is considered normal.    I spent a total of 30 minutes reviewing chart data, face-to-face evaluation with the patient, counseling and  coordination of care as detailed above.  Patient expressed understanding and was in agreement with this plan. She also understands that She can call clinic at any time with any questions, concerns, or complaints.    Jeralyn Ruths, MD   05/01/2023 4:39 PM

## 2023-05-09 NOTE — H&P (View-Only) (Signed)
Patient ID: Deborah Castillo, female   DOB: Jul 17, 1961, 62 y.o.   MRN: [id number]  Chief Complaint: DCIS left breast  History of Present Illness Deborah Castillo is a 62 y.o. female with a prior history of right breast conservation, with history of radiation.  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  Prognostic indicators were performed showing these are ER negative PR negative.  She is menopausal.  She has a family history of breast cancer in one of her aunts.  It is difficult to get any additional history from her.  Past Medical History Past Medical History:  Diagnosis Date   Asthma    B12 deficiency    Breast cancer (HCC) 2014   with radiation   Cataract    Diabetes mellitus without complication (HCC)    Family history of iron deficiency anemia    Hypertension    Mental retardation    Mixed hyperlipidemia    Personal history of radiation therapy    Schizophrenia (HCC)       Past Surgical History:  Procedure Laterality Date   ABDOMINAL HYSTERECTOMY     BREAST BIOPSY Right 2014   positive   BREAST BIOPSY Left 04/25/2023   Left Breast Stereo Bx, coil clip - path pending   BREAST BIOPSY Left 04/25/2023   MM LT BREAST BX W LOC DEV 1ST LESION IMAGE BX SPEC STEREO GUIDE 04/25/2023 [facility]   BREAST LUMPECTOMY Right 2014   INVASIVE MAMMARY CARCINOMA    No Known Allergies  Current Outpatient Medications  Medication Sig Dispense Refill   Acetaminophen 500 MG coapsule Take by mouth.     amLODipine (NORVASC) 10 MG tablet Take 10 mg by mouth daily.     atorvastatin (LIPITOR) 20 MG tablet Take 20 mg by mouth daily.     Azelastine-Fluticasone 137-50 MCG/ACT SUSP Place into the nose.     canagliflozin (INVOKANA) 100 MG TABS tablet Take by mouth.      cloNIDine (CATAPRES) 0.1 MG tablet Take 0.1 mg by mouth 3 (three) times daily.     cyanocobalamin 1000 MCG tablet Take 1 tablet by mouth daily.     diclofenac Sodium (VOLTAREN) 1 % GEL Apply topically.     empagliflozin (JARDIANCE) 10 MG TABS tablet Take by mouth daily.     fluPHENAZine (PROLIXIN) 1 MG tablet      fluPHENAZine (PROLIXIN) 2.5 MG tablet Take 2.5 mg by mouth at bedtime.     furosemide (LASIX) 20 MG tablet      gabapentin (NEURONTIN) 100 MG capsule Take by mouth.     glimepiride (AMARYL) 4 MG tablet Take by mouth.     hydrochlorothiazide (HYDRODIURIL) 25 MG tablet Take 25 mg by mouth daily.     isosorbide mononitrate (IMDUR) 120 MG 24 hr tablet Take 120 mg by mouth daily.     ketoconazole (NIZORAL) 2 % cream SMARTSIG:1 Application Topical 1 to 2 Times Daily     ketorolac (ACULAR) 0.5 % ophthalmic solution      lactulose (CHRONULAC) 10 GM/15ML solution Take 30 mLs (20 g total) by mouth daily as needed for mild constipation. 120 mL 0   linagliptin (TRADJENTA) 5 MG TABS tablet      metFORMIN (GLUCOPHAGE) 1000 MG tablet      metoprolol tartrate (LOPRESSOR) 100 MG tablet Take 100 mg by mouth 2 (two) times  daily.     ondansetron (ZOFRAN-ODT) 4 MG disintegrating tablet Take 1 tablet (4 mg total) by mouth every 8 (eight) hours as needed for nausea or vomiting. 20 tablet 0   pioglitazone (ACTOS) 15 MG tablet Take 15 mg by mouth daily.     cloNIDine (CATAPRES) 0.1 MG tablet Take 1 tablet (0.1 mg total) by mouth 3 (three) times daily for 15 days. 45 tablet 0   letrozole (FEMARA) 2.5 MG tablet Take 1 tablet (2.5 mg total) by mouth daily. (Patient not taking: Reported on 05/10/2023) 90 tablet 3   LORazepam (ATIVAN) 0.5 MG tablet Take 0.5 mg by mouth 2 (two) times daily as needed. (Patient not taking: Reported on 05/10/2023)     losartan (COZAAR) 100 MG tablet Take 100 mg by mouth 2 (two) times daily. (Patient not taking: Reported on 05/10/2023)     No current facility-administered medications  for this visit.    Family History Family History  Problem Relation Age of Onset   Breast cancer Neg Hx       Social History Social History   Tobacco Use   Smoking status: Never   Smokeless tobacco: Never  Vaping Use   Vaping status: Never Used  Substance Use Topics   Alcohol use: No   Drug use: No        Review of Systems  Unable to perform ROS: Mental acuity  Constitutional: Negative.   HENT: Negative.    Eyes: Negative.   Respiratory: Negative.    Cardiovascular: Negative.   Gastrointestinal: Negative.   Genitourinary: Negative.   Skin: Negative.   Neurological: Negative.   Psychiatric/Behavioral: Negative.       Physical Exam Blood pressure (!) 146/83, pulse 83, temperature 98.1 F (36.7 C), temperature source Oral, height 5\' 3"  (1.6 m), weight 285 lb 6.4 oz (129.5 kg), SpO2 100%. Last Weight  Most recent update: 05/10/2023 10:59 AM    Weight  129.5 kg (285 lb 6.4 oz)             CONSTITUTIONAL: Well developed, and nourished, appropriately responsive and aware without distress.   EYES: Sclera non-icteric.   EARS, NOSE, MOUTH AND THROAT:  The oropharynx is clear. Oral mucosa is pink and moist.    Hearing is intact to voice.  NECK: Trachea is midline, and there is no jugular venous distension.  LYMPH NODES:  Lymph nodes in the neck are not appreciated. RESPIRATORY:  Lungs are clear, and breath sounds are equal bilaterally.  Normal respiratory effort without pathologic use of accessory muscles. CARDIOVASCULAR: Heart is regular in rate and rhythm.   Well perfused.  GI: The abdomen is  soft, nontender, and nondistended. There were no palpable masses. GU: Breast exam deferred per patient request. MUSCULOSKELETAL:  Symmetrical muscle tone appreciated in all four extremities.    SKIN: Skin turgor is normal. No pathologic skin lesions appreciated.  NEUROLOGIC:  Motor and sensation appear grossly normal.  Cranial nerves are grossly without defect. PSYCH:  Alert  and oriented to person, place and time. Affect is appropriate for situation.  Data Reviewed I have personally reviewed what is currently available of the patient's imaging, recent labs and medical records.   Labs:     Latest Ref Rng & Units 03/04/2023    7:34 PM 09/28/2022    1:37 PM 06/02/2022    1:25 PM  CBC  WBC 4.0 - 10.5 K/uL 10.4  6.9  7.7   Hemoglobin 12.0 - 15.0 g/dL 40.9  81.1  13.9  Hematocrit 36.0 - 46.0 % 38.4  40.8  44.7   Platelets 150 - 400 K/uL 318  284  311       Latest Ref Rng & Units 03/04/2023    7:34 PM 09/28/2022    1:37 PM 06/02/2022    1:25 PM  CMP  Glucose 70 - 99 mg/dL 829  562  130   BUN 8 - 23 mg/dL 32  14  15   Creatinine 0.44 - 1.00 mg/dL 8.65  7.84  6.96   Sodium 135 - 145 mmol/L 134  141  138   Potassium 3.5 - 5.1 mmol/L 3.7  3.7  3.6   Chloride 98 - 111 mmol/L 96  109  103   CO2 22 - 32 mmol/L 24  24  24    Calcium 8.9 - 10.3 mg/dL 9.9  9.9  9.9   Total Protein 6.5 - 8.1 g/dL 8.3     Total Bilirubin 0.3 - 1.2 mg/dL 1.1     Alkaline Phos 38 - 126 U/L 76     AST 15 - 41 U/L 24     ALT 0 - 44 U/L 16      Patient: CAMBREA, KIRT Collected: 04/25/2023 Client: [facility] [place] Accession: [id number] Received: 04/25/2023 Jacob Moores, MD DOB: 1961-06-20 Age: 89 Gender: F Reported: 04/26/2023 [address], [address] Patient Ph: [phone] MRN #: [id number] [place], [place] [address] Client Acc#: Chart #: [id number] Phone: [phone] Fax: [phone] CC: [facility] CC CC: [facility] REPORT OF SURGICAL PATHOLOGY Addendum: Breast biomarker results FINAL DIAGNOSIS Diagnosis Breast, left, needle core biopsy, inner central (coil clip) HIGH-GRADE DUCTAL CARCINOMA IN SITU, SOLID TYPE WITH NECROSIS SHOWING CANCERIZATION OF LOBULES AND FOCAL MICROINVASION MICROCALCIFICATIONS PRESENT WITHIN DCIS AND ADJACENT BENIGN DUCTS AND ADENOSIS DCIS MEASURES UP TO 5 MM IN GREATEST LINEAR EXTENT Diagnosis Note Immunohistochemical  stains for the breast prognostic markers have been ordered and these results will be issued within a subsequent addendum to this report. Case is reviewed by Dr. Luisa Hart who concurs with the interpretation. Diagnosis called to Nickie at [facility] by Dr. Venetia Night on 04/26/2023 at 9:42 AM. Jerene Bears MD Pathologist, Electronic Signature (Case signed 04/26/2023) Specimen Gross and Clinical Information Specimen Comment TIF: 10:44 AM, screen detected, calcifications Specimen(s) Obtained: Breast, left, needle core biopsy, inner central (coil clip) Specimen Clinical Information DCIS vs atypia vs benign breast tissue 1 of 3 FINAL for DNYA, HICKLE ([id number]) Gross Received in formalin, labeled with patient name Orvilla, Truett and "left breast calcs inner central", is a radial specimen container with a 2.5 x 2.0 x 0.5 cm aggregate of yellow and white fibrofatty tissue fragments, submitted entirely in blocks 1A-1B. Collection time: 10:42 AM Time in formalin: 10:44 AM Cold ischemic time: 2 minutes SMB 04/25/23 01:42:13 PM Stain(s) used in Diagnosis: The following stain(s) were used in diagnosing the case: PR-ACIS, ER-ACIS. The control(s) stained appropriately. ADDITIONAL INFORMATION: 1) Breast, left, needle core biopsy, inner central ( coil clip) PROGNOSTIC INDICATORS Results: IMMUNOHISTOCHEMICAL AND MORPHOMETRIC ANALYSIS PERFORMED MANUALLY Estrogen Receptor: 0%, NEGATIVE Progesterone Receptor: 0%, NEGATIVE COMMENT: The negative hormone receptor study(ies) in this case has an internal positive control. REFERENCE RANGE ESTROGEN RECEPTOR NEGATIVE 0% POSITIVE =>1% REFERENCE RANGE PROGESTERONE RECEPTOR NEGATIVE 0% POSITIVE =>1% All controls stained appropriately Jimmy Picket MD Pathologist, Electronic Signature ( Signed 04/27/2023)   Imaging: Radiological images reviewed:  CLINICAL DATA:  Callback for LEFT breast calcifications. Patient  is developmentally delayed.  History of RIGHT breast cancer status post radiation.   EXAM: DIGITAL DIAGNOSTIC UNILATERAL LEFT MAMMOGRAM WITH TOMOSYNTHESIS   TECHNIQUE: Left digital diagnostic mammography and breast tomosynthesis was performed. Best images possible per technologist communication.   COMPARISON:  Previous exam(s).   ACR Breast Density Category b: There are scattered areas of fibroglandular density.   FINDINGS: Spot magnification views of the LEFT breast demonstrate loosely grouped coarse heterogeneous and punctate calcifications in a linear distribution lower inner breast at middle depth. These are not stable in comparison to prior mammograms. Dominant group spans approximately 2.5 cm but extends approximately 4.5 cm if inclusive of 2 adjacent faint punctate calcifications. Patient expressed limited tolerance of true lateral imaging.   IMPRESSION: There are at least 2.5 cm of indeterminate calcifications in the LEFT lower inner breast at middle depth. Recommend stereotactic guided biopsy for definitive characterization as per patient tolerance.   RECOMMENDATION: LEFT breast stereotactic guided biopsy x1   I have discussed the findings and recommendations with the patient and patient's caretaker. The biopsy procedure was discussed with the patient and questions were answered. Patient expressed their understanding of the biopsy recommendation. Patient will be scheduled for biopsy at her earliest convenience by the schedulers. Ordering provider will be notified. If applicable, a reminder letter will be sent to the patient regarding the next appointment.   BI-RADS CATEGORY  4: Suspicious.     Electronically Signed   By: Meda Klinefelter M.D.   On: 04/11/2023 11:31  CLINICAL DATA:  Post-procedure mammogram   EXAM: 3D DIAGNOSTIC LEFT MAMMOGRAM POST STEREOTACTIC BIOPSY   COMPARISON:  Previous exam(s).   FINDINGS: 3D Mammographic images were obtained  following stereotactic guided biopsy of calcifications in the inner central left breast. The coil shaped biopsy marking clip is approximately 15 mm laterally displaced from the site of biopsy. There are 10 mm of residual microcalcifications that are located 20 mm medial to the biopsy marking clip.   IMPRESSION: Approximately 15 mm lateral displacement of the coil shaped biopsy marking clip from the site of biopsy in the inner central left breast. Residual microcalcifications are noted.   Final Assessment: Post Procedure Mammograms for Marker Placement     Electronically Signed   By: Jacob Moores M.D.   On: 04/25/2023 11:27  Pathology results were discussed with the patient's social worker, Theodis Shove by telephone by Randa Lynn, RN Nurse Navigator, per request. The patient reported doing well after the biopsy with tenderness at the site. Post biopsy instructions and care were reviewed and questions were answered. The patient was encouraged to call [facility] for any additional concerns.   RECOMMENDATION:   1. Recommend surgical and oncology referrals. Consultation request was sent to Irving Shows, RN Nurse Navigator via secure EPIC message on April 26, 2023. Medical Oncology consultation has been arranged with Dr. Gerarda Fraction at [facility] on May 01, 2023. 2. If breast conservation therapy is being pursued, recommend bracket localization of the residual microcalcifications, which span approximately 2.5 cm.   Pathology results reported by Rene Kocher, RN on 04/27/2023.     Electronically Signed   By: Jacob Moores M.D.   On: 04/27/2023 14:09  Assessment     Patient Active Problem List   Diagnosis Date Noted   Ductal carcinoma in situ (DCIS) of left breast 05/01/2023   Hyperlipidemia due to type 2 diabetes mellitus (HCC) 02/21/2022   Asthma in adult 07/25/2017   Cataract 07/25/2017  Onychomycosis due to dermatophyte 07/25/2017   Diabetes mellitus type 2, uncontrolled 07/25/2017   H/O iron deficiency anemia 07/25/2017   Hypertension 07/25/2017   Mental retardation 07/25/2017   Mixed hyperlipidemia 07/25/2017   Personal history of breast cancer 07/25/2017   Schizophrenia (HCC) 07/25/2017   Type 2 diabetes mellitus with complication (HCC) 07/25/2017   Vitamin B12 deficiency 07/25/2017   Morbid obesity with BMI of 50.0-59.9, adult (HCC) 02/21/2017   Malignant neoplasm of female breast (HCC) 05/24/2015    Plan    Localization x 2 for left breast lumpectomy.  CPT 19125, [id number].  Bracketing area with 2 scalp tags for left breast lumpectomy, excising area of calcifications and known DCIS. Risk discussed with patient and 2 caregivers present.  The [facility] Department of social services will likely be contacted to present risks and benefits of the procedure for obtaining consent.   Face-to-face time spent with the patient and accompanying care providers(if present) was 30 minutes, with more than 50% of the time spent counseling, educating, and coordinating care of the patient.    These notes generated with voice recognition software. I apologize for typographical errors.  Campbell Lerner M.D., FACS 05/10/2023, 11:55 AM

## 2023-05-09 NOTE — Progress Notes (Unsigned)
Patient ID: Deborah Castillo, female   DOB: Jul 17, 1961, 62 y.o.   MRN: [id number]  Chief Complaint: DCIS left breast  History of Present Illness Deborah Castillo is a 62 y.o. female with a prior history of right breast conservation, with history of radiation.  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  Prognostic indicators were performed showing these are ER negative PR negative.  She is menopausal.  She has a family history of breast cancer in one of her aunts.  It is difficult to get any additional history from her.  Past Medical History Past Medical History:  Diagnosis Date   Asthma    B12 deficiency    Breast cancer (HCC) 2014   with radiation   Cataract    Diabetes mellitus without complication (HCC)    Family history of iron deficiency anemia    Hypertension    Mental retardation    Mixed hyperlipidemia    Personal history of radiation therapy    Schizophrenia (HCC)       Past Surgical History:  Procedure Laterality Date   ABDOMINAL HYSTERECTOMY     BREAST BIOPSY Right 2014   positive   BREAST BIOPSY Left 04/25/2023   Left Breast Stereo Bx, coil clip - path pending   BREAST BIOPSY Left 04/25/2023   MM LT BREAST BX W LOC DEV 1ST LESION IMAGE BX SPEC STEREO GUIDE 04/25/2023 [facility]   BREAST LUMPECTOMY Right 2014   INVASIVE MAMMARY CARCINOMA    No Known Allergies  Current Outpatient Medications  Medication Sig Dispense Refill   Acetaminophen 500 MG coapsule Take by mouth.     amLODipine (NORVASC) 10 MG tablet Take 10 mg by mouth daily.     atorvastatin (LIPITOR) 20 MG tablet Take 20 mg by mouth daily.     Azelastine-Fluticasone 137-50 MCG/ACT SUSP Place into the nose.     canagliflozin (INVOKANA) 100 MG TABS tablet Take by mouth.      cloNIDine (CATAPRES) 0.1 MG tablet Take 0.1 mg by mouth 3 (three) times daily.     cyanocobalamin 1000 MCG tablet Take 1 tablet by mouth daily.     diclofenac Sodium (VOLTAREN) 1 % GEL Apply topically.     empagliflozin (JARDIANCE) 10 MG TABS tablet Take by mouth daily.     fluPHENAZine (PROLIXIN) 1 MG tablet      fluPHENAZine (PROLIXIN) 2.5 MG tablet Take 2.5 mg by mouth at bedtime.     furosemide (LASIX) 20 MG tablet      gabapentin (NEURONTIN) 100 MG capsule Take by mouth.     glimepiride (AMARYL) 4 MG tablet Take by mouth.     hydrochlorothiazide (HYDRODIURIL) 25 MG tablet Take 25 mg by mouth daily.     isosorbide mononitrate (IMDUR) 120 MG 24 hr tablet Take 120 mg by mouth daily.     ketoconazole (NIZORAL) 2 % cream SMARTSIG:1 Application Topical 1 to 2 Times Daily     ketorolac (ACULAR) 0.5 % ophthalmic solution      lactulose (CHRONULAC) 10 GM/15ML solution Take 30 mLs (20 g total) by mouth daily as needed for mild constipation. 120 mL 0   linagliptin (TRADJENTA) 5 MG TABS tablet      metFORMIN (GLUCOPHAGE) 1000 MG tablet      metoprolol tartrate (LOPRESSOR) 100 MG tablet Take 100 mg by mouth 2 (two) times  daily.     ondansetron (ZOFRAN-ODT) 4 MG disintegrating tablet Take 1 tablet (4 mg total) by mouth every 8 (eight) hours as needed for nausea or vomiting. 20 tablet 0   pioglitazone (ACTOS) 15 MG tablet Take 15 mg by mouth daily.     cloNIDine (CATAPRES) 0.1 MG tablet Take 1 tablet (0.1 mg total) by mouth 3 (three) times daily for 15 days. 45 tablet 0   letrozole (FEMARA) 2.5 MG tablet Take 1 tablet (2.5 mg total) by mouth daily. (Patient not taking: Reported on 05/10/2023) 90 tablet 3   LORazepam (ATIVAN) 0.5 MG tablet Take 0.5 mg by mouth 2 (two) times daily as needed. (Patient not taking: Reported on 05/10/2023)     losartan (COZAAR) 100 MG tablet Take 100 mg by mouth 2 (two) times daily. (Patient not taking: Reported on 05/10/2023)     No current facility-administered medications  for this visit.    Family History Family History  Problem Relation Age of Onset   Breast cancer Neg Hx       Social History Social History   Tobacco Use   Smoking status: Never   Smokeless tobacco: Never  Vaping Use   Vaping status: Never Used  Substance Use Topics   Alcohol use: No   Drug use: No        Review of Systems  Unable to perform ROS: Mental acuity  Constitutional: Negative.   HENT: Negative.    Eyes: Negative.   Respiratory: Negative.    Cardiovascular: Negative.   Gastrointestinal: Negative.   Genitourinary: Negative.   Skin: Negative.   Neurological: Negative.   Psychiatric/Behavioral: Negative.       Physical Exam Blood pressure (!) 146/83, pulse 83, temperature 98.1 F (36.7 C), temperature source Oral, height 5\' 3"  (1.6 m), weight 285 lb 6.4 oz (129.5 kg), SpO2 100%. Last Weight  Most recent update: 05/10/2023 10:59 AM    Weight  129.5 kg (285 lb 6.4 oz)             CONSTITUTIONAL: Well developed, and nourished, appropriately responsive and aware without distress.   EYES: Sclera non-icteric.   EARS, NOSE, MOUTH AND THROAT:  The oropharynx is clear. Oral mucosa is pink and moist.    Hearing is intact to voice.  NECK: Trachea is midline, and there is no jugular venous distension.  LYMPH NODES:  Lymph nodes in the neck are not appreciated. RESPIRATORY:  Lungs are clear, and breath sounds are equal bilaterally.  Normal respiratory effort without pathologic use of accessory muscles. CARDIOVASCULAR: Heart is regular in rate and rhythm.   Well perfused.  GI: The abdomen is  soft, nontender, and nondistended. There were no palpable masses. GU: Breast exam deferred per patient request. MUSCULOSKELETAL:  Symmetrical muscle tone appreciated in all four extremities.    SKIN: Skin turgor is normal. No pathologic skin lesions appreciated.  NEUROLOGIC:  Motor and sensation appear grossly normal.  Cranial nerves are grossly without defect. PSYCH:  Alert  and oriented to person, place and time. Affect is appropriate for situation.  Data Reviewed I have personally reviewed what is currently available of the patient's imaging, recent labs and medical records.   Labs:     Latest Ref Rng & Units 03/04/2023    7:34 PM 09/28/2022    1:37 PM 06/02/2022    1:25 PM  CBC  WBC 4.0 - 10.5 K/uL 10.4  6.9  7.7   Hemoglobin 12.0 - 15.0 g/dL 40.9  81.1  13.9  Hematocrit 36.0 - 46.0 % 38.4  40.8  44.7   Platelets 150 - 400 K/uL 318  284  311       Latest Ref Rng & Units 03/04/2023    7:34 PM 09/28/2022    1:37 PM 06/02/2022    1:25 PM  CMP  Glucose 70 - 99 mg/dL 829  562  130   BUN 8 - 23 mg/dL 32  14  15   Creatinine 0.44 - 1.00 mg/dL 8.65  7.84  6.96   Sodium 135 - 145 mmol/L 134  141  138   Potassium 3.5 - 5.1 mmol/L 3.7  3.7  3.6   Chloride 98 - 111 mmol/L 96  109  103   CO2 22 - 32 mmol/L 24  24  24    Calcium 8.9 - 10.3 mg/dL 9.9  9.9  9.9   Total Protein 6.5 - 8.1 g/dL 8.3     Total Bilirubin 0.3 - 1.2 mg/dL 1.1     Alkaline Phos 38 - 126 U/L 76     AST 15 - 41 U/L 24     ALT 0 - 44 U/L 16      Patient: CAMBREA, KIRT Collected: 04/25/2023 Client: [facility] [place] Accession: [id number] Received: 04/25/2023 Jacob Moores, MD DOB: 1961-06-20 Age: 89 Gender: F Reported: 04/26/2023 [address], [address] Patient Ph: [phone] MRN #: [id number] [place], [place] [address] Client Acc#: Chart #: [id number] Phone: [phone] Fax: [phone] CC: [facility] CC CC: [facility] REPORT OF SURGICAL PATHOLOGY Addendum: Breast biomarker results FINAL DIAGNOSIS Diagnosis Breast, left, needle core biopsy, inner central (coil clip) HIGH-GRADE DUCTAL CARCINOMA IN SITU, SOLID TYPE WITH NECROSIS SHOWING CANCERIZATION OF LOBULES AND FOCAL MICROINVASION MICROCALCIFICATIONS PRESENT WITHIN DCIS AND ADJACENT BENIGN DUCTS AND ADENOSIS DCIS MEASURES UP TO 5 MM IN GREATEST LINEAR EXTENT Diagnosis Note Immunohistochemical  stains for the breast prognostic markers have been ordered and these results will be issued within a subsequent addendum to this report. Case is reviewed by Dr. Luisa Hart who concurs with the interpretation. Diagnosis called to Nickie at [facility] by Dr. Venetia Night on 04/26/2023 at 9:42 AM. Jerene Bears MD Pathologist, Electronic Signature (Case signed 04/26/2023) Specimen Gross and Clinical Information Specimen Comment TIF: 10:44 AM, screen detected, calcifications Specimen(s) Obtained: Breast, left, needle core biopsy, inner central (coil clip) Specimen Clinical Information DCIS vs atypia vs benign breast tissue 1 of 3 FINAL for DNYA, HICKLE ([id number]) Gross Received in formalin, labeled with patient name Orvilla, Truett and "left breast calcs inner central", is a radial specimen container with a 2.5 x 2.0 x 0.5 cm aggregate of yellow and white fibrofatty tissue fragments, submitted entirely in blocks 1A-1B. Collection time: 10:42 AM Time in formalin: 10:44 AM Cold ischemic time: 2 minutes SMB 04/25/23 01:42:13 PM Stain(s) used in Diagnosis: The following stain(s) were used in diagnosing the case: PR-ACIS, ER-ACIS. The control(s) stained appropriately. ADDITIONAL INFORMATION: 1) Breast, left, needle core biopsy, inner central ( coil clip) PROGNOSTIC INDICATORS Results: IMMUNOHISTOCHEMICAL AND MORPHOMETRIC ANALYSIS PERFORMED MANUALLY Estrogen Receptor: 0%, NEGATIVE Progesterone Receptor: 0%, NEGATIVE COMMENT: The negative hormone receptor study(ies) in this case has an internal positive control. REFERENCE RANGE ESTROGEN RECEPTOR NEGATIVE 0% POSITIVE =>1% REFERENCE RANGE PROGESTERONE RECEPTOR NEGATIVE 0% POSITIVE =>1% All controls stained appropriately Jimmy Picket MD Pathologist, Electronic Signature ( Signed 04/27/2023)   Imaging: Radiological images reviewed:  CLINICAL DATA:  Callback for LEFT breast calcifications. Patient  is developmentally delayed.  History of RIGHT breast cancer status post radiation.   EXAM: DIGITAL DIAGNOSTIC UNILATERAL LEFT MAMMOGRAM WITH TOMOSYNTHESIS   TECHNIQUE: Left digital diagnostic mammography and breast tomosynthesis was performed. Best images possible per technologist communication.   COMPARISON:  Previous exam(s).   ACR Breast Density Category b: There are scattered areas of fibroglandular density.   FINDINGS: Spot magnification views of the LEFT breast demonstrate loosely grouped coarse heterogeneous and punctate calcifications in a linear distribution lower inner breast at middle depth. These are not stable in comparison to prior mammograms. Dominant group spans approximately 2.5 cm but extends approximately 4.5 cm if inclusive of 2 adjacent faint punctate calcifications. Patient expressed limited tolerance of true lateral imaging.   IMPRESSION: There are at least 2.5 cm of indeterminate calcifications in the LEFT lower inner breast at middle depth. Recommend stereotactic guided biopsy for definitive characterization as per patient tolerance.   RECOMMENDATION: LEFT breast stereotactic guided biopsy x1   I have discussed the findings and recommendations with the patient and patient's caretaker. The biopsy procedure was discussed with the patient and questions were answered. Patient expressed their understanding of the biopsy recommendation. Patient will be scheduled for biopsy at her earliest convenience by the schedulers. Ordering provider will be notified. If applicable, a reminder letter will be sent to the patient regarding the next appointment.   BI-RADS CATEGORY  4: Suspicious.     Electronically Signed   By: Meda Klinefelter M.D.   On: 04/11/2023 11:31  CLINICAL DATA:  Post-procedure mammogram   EXAM: 3D DIAGNOSTIC LEFT MAMMOGRAM POST STEREOTACTIC BIOPSY   COMPARISON:  Previous exam(s).   FINDINGS: 3D Mammographic images were obtained  following stereotactic guided biopsy of calcifications in the inner central left breast. The coil shaped biopsy marking clip is approximately 15 mm laterally displaced from the site of biopsy. There are 10 mm of residual microcalcifications that are located 20 mm medial to the biopsy marking clip.   IMPRESSION: Approximately 15 mm lateral displacement of the coil shaped biopsy marking clip from the site of biopsy in the inner central left breast. Residual microcalcifications are noted.   Final Assessment: Post Procedure Mammograms for Marker Placement     Electronically Signed   By: Jacob Moores M.D.   On: 04/25/2023 11:27  Pathology results were discussed with the patient's social worker, Theodis Shove by telephone by Randa Lynn, RN Nurse Navigator, per request. The patient reported doing well after the biopsy with tenderness at the site. Post biopsy instructions and care were reviewed and questions were answered. The patient was encouraged to call [facility] for any additional concerns.   RECOMMENDATION:   1. Recommend surgical and oncology referrals. Consultation request was sent to Irving Shows, RN Nurse Navigator via secure EPIC message on April 26, 2023. Medical Oncology consultation has been arranged with Dr. Gerarda Fraction at [facility] on May 01, 2023. 2. If breast conservation therapy is being pursued, recommend bracket localization of the residual microcalcifications, which span approximately 2.5 cm.   Pathology results reported by Rene Kocher, RN on 04/27/2023.     Electronically Signed   By: Jacob Moores M.D.   On: 04/27/2023 14:09  Assessment     Patient Active Problem List   Diagnosis Date Noted   Ductal carcinoma in situ (DCIS) of left breast 05/01/2023   Hyperlipidemia due to type 2 diabetes mellitus (HCC) 02/21/2022   Asthma in adult 07/25/2017   Cataract 07/25/2017  Onychomycosis due to dermatophyte 07/25/2017   Diabetes mellitus type 2, uncontrolled 07/25/2017   H/O iron deficiency anemia 07/25/2017   Hypertension 07/25/2017   Mental retardation 07/25/2017   Mixed hyperlipidemia 07/25/2017   Personal history of breast cancer 07/25/2017   Schizophrenia (HCC) 07/25/2017   Type 2 diabetes mellitus with complication (HCC) 07/25/2017   Vitamin B12 deficiency 07/25/2017   Morbid obesity with BMI of 50.0-59.9, adult (HCC) 02/21/2017   Malignant neoplasm of female breast (HCC) 05/24/2015    Plan    Localization x 2 for left breast lumpectomy.  CPT 19125, [id number].  Bracketing area with 2 scalp tags for left breast lumpectomy, excising area of calcifications and known DCIS. Risk discussed with patient and 2 caregivers present.  The [facility] Department of social services will likely be contacted to present risks and benefits of the procedure for obtaining consent.   Face-to-face time spent with the patient and accompanying care providers(if present) was 30 minutes, with more than 50% of the time spent counseling, educating, and coordinating care of the patient.    These notes generated with voice recognition software. I apologize for typographical errors.  Campbell Lerner M.D., FACS 05/10/2023, 11:55 AM

## 2023-05-10 ENCOUNTER — Ambulatory Visit (INDEPENDENT_AMBULATORY_CARE_PROVIDER_SITE_OTHER): Payer: Medicare Other | Admitting: Surgery

## 2023-05-10 ENCOUNTER — Other Ambulatory Visit: Payer: Self-pay | Admitting: Surgery

## 2023-05-10 ENCOUNTER — Encounter: Payer: Self-pay | Admitting: Surgery

## 2023-05-10 VITALS — BP 146/83 | HR 83 | Temp 98.1°F | Ht 63.0 in | Wt 285.4 lb

## 2023-05-10 DIAGNOSIS — D0512 Intraductal carcinoma in situ of left breast: Secondary | ICD-10-CM

## 2023-05-10 DIAGNOSIS — R921 Mammographic calcification found on diagnostic imaging of breast: Secondary | ICD-10-CM

## 2023-05-10 NOTE — Patient Instructions (Signed)
Our surgery scheduler Barbara will call you within 24-48 hours to get you scheduled. If you have not heard from her after 48 hours, please call our office. Have the blue sheet available when she calls to write down important information.  If you have any concerns or questions, please feel free to call our office.   Lumpectomy  A lumpectomy, sometimes called a partial mastectomy, is surgery to remove a cancerous tumor or mass (the lump) from a breast. It is a form of breast-conserving or breast-preservation surgery. This means that the cancerous tissue is removed but the breast remains intact. During a lumpectomy, the portion of the breast that contains the tumor is removed. Lymph nodes under your arm may also be removed. Lymph nodes are part of the body's disease-fighting system (immune system) and are usually the first place where breast cancer spreads. Tell a health care provider about: Any allergies you have. All medicines you are taking, including vitamins, herbs, eye drops, creams, and over-the-counter medicines. Any problems you or family members have had with anesthetic medicines. Any bleeding problems you have. Any surgeries you have had. Any medical conditions you have. Whether you are pregnant or may be pregnant. What are the risks? Generally, this is a safe procedure. However, problems may occur, including: Bleeding. Infection. Allergic reaction to medicines. Pain, swelling, weakness, or numbness in the arm on the side of your surgery. Temporary swelling. Change in the shape of the breast, especially if a large portion is removed. Scar tissue that forms at the surgical site and feels hard to the touch. Blood clots. What happens before the procedure? When to stop eating and drinking Follow instructions from your health care provider about what you may eat and drink. These may include: 8 hours before your procedure Stop eating most foods. Do not eat meat, fried foods, or fatty  foods. Eat only light foods, such as toast or crackers. All liquids are okay except energy drinks and alcohol. 6 hours before your procedure Stop eating. Drink only clear liquids, such as water, clear fruit juice, black coffee, plain tea, and sports drinks. Do not drink energy drinks or alcohol. 2 hours before your procedure Stop drinking all liquids. You may be allowed to take medicines with small sips of water. If you do not follow your health care provider's instructions, your procedure may be delayed or canceled. Medicines Ask your health care provider about: Changing or stopping your regular medicines. These include any diabetes medicines or blood thinners you take. Taking medicines such as aspirin and ibuprofen. These medicines can thin your blood. Do not take them unless your health care provider tells you to. Taking over-the-counter medicines, vitamins, herbs, and supplements. Surgery safety Ask your health care provider how your surgery site will be marked. A procedure may be done to locate and mark the tumor area in the breast (localization). This will guide the surgeon to where the incision will be made. This may be done with: Imaging, such as a mammogram, ultrasound, or MRI. Insertion of a small wire, clip, or seed, or an implant that will reflect a radar signal. Also, ask what steps will be taken to help prevent infection. These may include: Washing skin with a germ-killing soap. Taking antibiotic medicine. General instructions You may have screening tests or exams to get normal measurements of your arm, also called baseline measurements. These can be compared to measurements done after surgery to monitor for swelling (lymphedema) that can develop after having lymph nodes removed. If you   will be going home right after the procedure, plan to have a responsible adult: Take you home from the hospital or clinic. You will not be allowed to drive. Care for you for the time you are  told. What happens during the procedure?  An IV will be inserted into one of your veins. You may be given: A sedative. This helps you relax. Anesthesia. This will: Numb certain areas of your body. Make you fall asleep for surgery. An electric scalpel will be used to reduce bleeding (electrocautery knife). A curved incision that follows the natural curve of your breast will be made. The tumor will be removed along with some of the tissue around it. This will be sent to the lab for testing. Your health care provider may also remove lymph nodes at this time if needed. If the tumor is close to the muscles over your chest, some muscle tissue may also be removed. A small drain tube may be inserted into your breast area or armpit to collect fluid that may build up after surgery. This tube will be connected to a suction bulb on the outside of your body to remove the fluid. The incision will be closed with stitches (sutures). A bandage (dressing) may be placed over the incision. The procedure may vary among health care providers and hospitals. What happens after the procedure? Your blood pressure, heart rate, breathing rate, and blood oxygen level will be monitored until you leave the hospital or clinic. You will be given medicine for pain as needed. You will be encouraged to get up and walk as soon as you can. This will improve blood flow and breathing. Ask for help if you feel weak or unsteady. You may have a drain tube in place for 2-3 days to prevent a collection of blood (hematoma) from developing in the breast. You may have a pressure bandage applied for 1-2 days to prevent bleeding or swelling. Ask your health care provider how to care for your bandage at home. You may be given a tight sleeve to wear over your arm on the side of your surgery. Wear the sleeve as told by your health care provider. Do not drive or operate machinery until your health care provider says that it is safe. Where to  find more information American Cancer Society: cancer.org National Cancer Institute: cancer.gov Summary A lumpectomy, sometimes called a partial mastectomy, is surgery to remove a cancerous tumor or mass (the lump) from a breast. During a lumpectomy, the portion of the breast that contains the tumor is removed. Plan to have someone take you home from the hospital or clinic. You may have a drain tube in place for 2-3 days to prevent a collection of blood (hematoma) from developing in the breast. This information is not intended to replace advice given to you by your health care provider. Make sure you discuss any questions you have with your health care provider. Document Revised: 01/09/2022 Document Reviewed: 12/25/2021 Elsevier Patient Education  2024 Elsevier Inc.  

## 2023-05-11 ENCOUNTER — Telehealth: Payer: Self-pay | Admitting: *Deleted

## 2023-05-11 ENCOUNTER — Encounter: Payer: Self-pay | Admitting: *Deleted

## 2023-05-11 DIAGNOSIS — D0512 Intraductal carcinoma in situ of left breast: Secondary | ICD-10-CM

## 2023-05-11 NOTE — Telephone Encounter (Signed)
[name] returned my call to confirm the appointments with Dr. Orlie Dakin and Dr. Rushie Chestnut

## 2023-05-11 NOTE — Progress Notes (Signed)
Deborah Castillo surgery is scheduled for 8/7.   She will see Dr. Orlie Dakin and Dr. Rushie Chestnut on 8/21.  Appt. Details left on [name] her social worker/guardian's voicemail.

## 2023-05-16 ENCOUNTER — Telehealth: Payer: Self-pay | Admitting: Surgery

## 2023-05-16 NOTE — Telephone Encounter (Signed)
Left message with legal guardian, Theodis Shove [phone]).  Please inform her of the following regarding scheduled surgery with Dr. Claudine Mouton.    Pre-Admission date/time, and Surgery date at [facility].  Surgery Date: 06/06/23 Preadmission Testing Date: 05/28/23 (phone 8a-1p)  Also they will need to call at [phone], between 1-3:00pm the day before surgery, to find out what time to arrive for surgery.    Reminder tag placement at the [facility] 05/23/23 @ 3:20 pm

## 2023-05-21 NOTE — Telephone Encounter (Signed)
Called again, spoke with legal guardian, Deborah Castillo. They are now aware of all dates regarding surgery.

## 2023-05-23 ENCOUNTER — Ambulatory Visit
Admission: RE | Admit: 2023-05-23 | Discharge: 2023-05-23 | Disposition: A | Discharge status: Home / Self Care | Payer: Medicare Other | Source: Ambulatory Visit | Attending: Surgery | Admitting: Surgery

## 2023-05-23 DIAGNOSIS — R921 Mammographic calcification found on diagnostic imaging of breast: Secondary | ICD-10-CM

## 2023-05-23 DIAGNOSIS — D0512 Intraductal carcinoma in situ of left breast: Secondary | ICD-10-CM | POA: Diagnosis present

## 2023-05-23 HISTORY — PX: BREAST BIOPSY: SHX20

## 2023-05-23 MED ORDER — LIDOCAINE HCL 1 % IJ SOLN
10.0000 mL | Freq: Once | INTRAMUSCULAR | Status: AC
  Administered 2023-05-23: 10 mL

## 2023-05-23 MED ORDER — LIDOCAINE 1 % OPTIME INJ - NO CHARGE
10.0000 mL | Freq: Once | INTRAMUSCULAR | Status: AC
  Administered 2023-05-23: 10 mL
  Filled 2023-05-23: qty 10

## 2023-05-28 ENCOUNTER — Other Ambulatory Visit: Payer: Self-pay

## 2023-05-28 ENCOUNTER — Telehealth: Payer: Self-pay | Admitting: Surgery

## 2023-05-28 ENCOUNTER — Encounter
Admission: RE | Admit: 2023-05-28 | Discharge: 2023-05-28 | Disposition: A | Discharge status: Home / Self Care | Payer: Medicare Other | Source: Ambulatory Visit | Attending: Surgery | Admitting: Surgery

## 2023-05-28 ENCOUNTER — Ambulatory Visit: Payer: Self-pay | Admitting: Surgery

## 2023-05-28 VITALS — Ht 63.0 in | Wt 290.0 lb

## 2023-05-28 DIAGNOSIS — D0512 Intraductal carcinoma in situ of left breast: Secondary | ICD-10-CM

## 2023-05-28 DIAGNOSIS — Z01812 Encounter for preprocedural laboratory examination: Secondary | ICD-10-CM

## 2023-05-28 DIAGNOSIS — E118 Type 2 diabetes mellitus with unspecified complications: Secondary | ICD-10-CM

## 2023-05-28 HISTORY — DX: Intraductal carcinoma in situ of left breast: D05.12

## 2023-05-28 HISTORY — DX: Malignant neoplasm of unspecified site of unspecified female breast: C50.919

## 2023-05-28 HISTORY — DX: Type 2 diabetes mellitus with unspecified complications: E11.8

## 2023-05-28 HISTORY — DX: Body mass index (BMI) 50.0-59.9, adult: E66.01

## 2023-05-28 HISTORY — DX: Tinea unguium: B35.1

## 2023-05-28 NOTE — Patient Instructions (Addendum)
Your procedure is scheduled on:  Wednesday, August 7  Report to the Registration Desk on the 1st floor of the [facility] To find out your arrival time, please call [phone] between 1PM - 3PM on:  Tuesday August 6  If your arrival time is 6:00 am, do not arrive before that time as the [facility] entrance doors do not open until 6:00 am.  REMEMBER: Instructions that are not followed completely may result in serious medical risk, up to and including death; or upon the discretion of your surgeon and anesthesiologist your surgery may need to be rescheduled.  Do not eat food or drink liquids after midnight the night before surgery.  No gum chewing or hard candies.   One week prior to surgery: Starting Wednesday July 31 Stop Anti-inflammatories (NSAIDS) such as Advil, Aleve, Ibuprofen, Motrin, Naproxen, Naprosyn and Aspirin based products such as Excedrin, Goody's Powder, BC Powder. Stop ANY OVER THE COUNTER supplements until after surgery. cyanocobalamin  diclofenac Sodium  ( Voltaren)   You may however, continue to take Tylenol (Acetaminophen) if needed for pain up until the day of surgery.  Continue taking all prescribed medications with the exception of the following: canagliflozin [facility]) stop 3 days prior to surgery, Last dose Saturday, August 3  empagliflozin (JARDIANCE)  Stop 3 days prior to surgery, Last dose Saturday August 3 furosemide (LASIX) stop the day of surgery , last dose Tuesday August 6 glimepiride (AMARYL) stop the day of surgery, Last dose Tuesday August 6  hydrochlorothiazide (HYDRODIURIL) stop the day of surgery , last dose Tuesday August 6  metFORMIN (GLUCOPHAGE) Stop 2 days prior to surgery, last dose Sunday August 4         **Follow guidelines for insulin and diabetes medications**  Follow recommendations from Cardiologist or PCP regarding stopping blood thinners.  TAKE ONLY THESE MEDICATIONS THE MORNING OF SURGERY WITH A SIP OF  WATER:  amLODipine (NORVASC)  atorvastatin (LIPITOR)  Azelastine-Fluticasone  cloNIDine (CATAPRES)  gabapentin (NEURONTIN) letrozole (FEMARA  isosorbide mononitrate (IMDUR)  metoprolol tartrate (LOPRESSOR)  ondansetron (ZOFRAN-ODT)  (take one the night before and one on the morning of surgery - helps to prevent nausea after surgery.)   No Alcohol for 24 hours before or after surgery.  No Smoking including e-cigarettes for 24 hours before surgery.  No chewable tobacco products for at least 6 hours before surgery.  No nicotine patches on the day of surgery.  Do not use any "recreational" drugs for at least a week (preferably 2 weeks) before your surgery.  Please be advised that the combination of cocaine and anesthesia may have negative outcomes, up to and including death. If you test positive for cocaine, your surgery will be cancelled.  On the morning of surgery brush your teeth with toothpaste and water, you may rinse your mouth with mouthwash if you wish. Do not swallow any toothpaste or mouthwash.  Use CHG Soap as directed on instruction sheet.  Do not wear jewelry, make-up, hairpins, clips or nail polish.  Do not wear lotions, powders, or perfumes.   Do not shave body hair from the neck down 48 hours before surgery.  Contact lenses, hearing aids and dentures may not be worn into surgery.  Do not bring valuables to the hospital. [facility] is not responsible for any missing/lost belongings or valuables.   Notify your doctor if there is any change in your medical condition (cold, fever, infection).  Wear comfortable clothing (specific to your surgery type) to the hospital.  After surgery, you can help prevent lung complications by doing breathing exercises.  Take deep breaths and cough every 1-2 hours.  If you are being discharged the day of surgery, you will not be allowed to drive home. You will need a responsible individual to drive you home and stay with you for  24 hours after surgery.   If you are taking public transportation, you will need to have a responsible individual with you.  Please call the Pre-admissions Testing Dept. at [phone] if you have any questions about these instructions.  Surgery Visitation Policy:  Patients having surgery or a procedure may have two visitors.  Children under the age of 8 must have an adult with them who is not the patient.

## 2023-05-28 NOTE — Telephone Encounter (Signed)
Dr. Claudine Mouton has added SLN bx to the case for 06/06/23.  Called and left detailed message with legal guardian, Theodis Shove. Arrival time needs to be 8:00 am on 06/06/23, no later than 8:15 am.

## 2023-05-30 ENCOUNTER — Telehealth: Payer: Self-pay

## 2023-05-31 ENCOUNTER — Encounter
Admission: RE | Admit: 2023-05-31 | Discharge: 2023-05-31 | Disposition: A | Discharge status: Home / Self Care | Payer: Medicare Other | Source: Ambulatory Visit | Attending: Surgery | Admitting: Surgery

## 2023-05-31 DIAGNOSIS — D0512 Intraductal carcinoma in situ of left breast: Secondary | ICD-10-CM | POA: Diagnosis not present

## 2023-05-31 DIAGNOSIS — Z01818 Encounter for other preprocedural examination: Secondary | ICD-10-CM | POA: Insufficient documentation

## 2023-05-31 DIAGNOSIS — I10 Essential (primary) hypertension: Secondary | ICD-10-CM | POA: Diagnosis not present

## 2023-05-31 DIAGNOSIS — Z01812 Encounter for preprocedural laboratory examination: Secondary | ICD-10-CM

## 2023-05-31 DIAGNOSIS — E118 Type 2 diabetes mellitus with unspecified complications: Secondary | ICD-10-CM | POA: Insufficient documentation

## 2023-05-31 LAB — CBC WITH DIFFERENTIAL/PLATELET
Abs Immature Granulocytes: 0.02 10*3/uL (ref 0.00–0.07)
Basophils Absolute: 0 10*3/uL (ref 0.0–0.1)
Basophils Relative: 0 %
Eosinophils Absolute: 0.1 10*3/uL (ref 0.0–0.5)
Eosinophils Relative: 1 %
HCT: 36.3 % (ref 36.0–46.0)
Hemoglobin: 11.7 g/dL — ABNORMAL LOW (ref 12.0–15.0)
Immature Granulocytes: 0 %
Lymphocytes Relative: 19 %
Lymphs Abs: 1.6 10*3/uL (ref 0.7–4.0)
MCH: 26.8 pg (ref 26.0–34.0)
MCHC: 32.2 g/dL (ref 30.0–36.0)
MCV: 83.3 fL (ref 80.0–100.0)
Monocytes Absolute: 0.7 10*3/uL (ref 0.1–1.0)
Monocytes Relative: 8 %
Neutro Abs: 5.8 10*3/uL (ref 1.7–7.7)
Neutrophils Relative %: 72 %
Platelets: 313 10*3/uL (ref 150–400)
RBC: 4.36 MIL/uL (ref 3.87–5.11)
RDW: 14.3 % (ref 11.5–15.5)
WBC: 8.1 10*3/uL (ref 4.0–10.5)
nRBC: 0 % (ref 0.0–0.2)

## 2023-06-06 ENCOUNTER — Other Ambulatory Visit: Payer: Self-pay

## 2023-06-06 ENCOUNTER — Encounter: Payer: Self-pay | Admitting: Surgery

## 2023-06-06 ENCOUNTER — Ambulatory Visit: Payer: Medicare Other | Admitting: Urgent Care

## 2023-06-06 ENCOUNTER — Encounter
Admission: RE | Disposition: A | Discharge status: Home / Self Care | Payer: Self-pay | Source: Home / Self Care | Attending: Surgery

## 2023-06-06 ENCOUNTER — Ambulatory Visit
Admission: RE | Admit: 2023-06-06 | Discharge: 2023-06-06 | Disposition: A | Discharge status: Home / Self Care | Payer: Medicare Other | Attending: Surgery | Admitting: Surgery

## 2023-06-06 ENCOUNTER — Ambulatory Visit
Admission: RE | Admit: 2023-06-06 | Discharge: 2023-06-06 | Disposition: A | Discharge status: Home / Self Care | Payer: Medicare Other | Source: Ambulatory Visit | Attending: Surgery | Admitting: Surgery

## 2023-06-06 ENCOUNTER — Encounter
Admission: RE | Admit: 2023-06-06 | Discharge: 2023-06-06 | Disposition: A | Discharge status: Home / Self Care | Payer: Medicare Other | Source: Ambulatory Visit | Attending: Surgery | Admitting: Surgery

## 2023-06-06 DIAGNOSIS — E118 Type 2 diabetes mellitus with unspecified complications: Secondary | ICD-10-CM

## 2023-06-06 DIAGNOSIS — D0512 Intraductal carcinoma in situ of left breast: Secondary | ICD-10-CM

## 2023-06-06 DIAGNOSIS — Z923 Personal history of irradiation: Secondary | ICD-10-CM | POA: Insufficient documentation

## 2023-06-06 DIAGNOSIS — F79 Unspecified intellectual disabilities: Secondary | ICD-10-CM | POA: Insufficient documentation

## 2023-06-06 DIAGNOSIS — Z171 Estrogen receptor negative status [ER-]: Secondary | ICD-10-CM | POA: Diagnosis not present

## 2023-06-06 DIAGNOSIS — Z7984 Long term (current) use of oral hypoglycemic drugs: Secondary | ICD-10-CM | POA: Diagnosis not present

## 2023-06-06 DIAGNOSIS — Z6841 Body Mass Index (BMI) 40.0 and over, adult: Secondary | ICD-10-CM | POA: Insufficient documentation

## 2023-06-06 DIAGNOSIS — J45909 Unspecified asthma, uncomplicated: Secondary | ICD-10-CM | POA: Insufficient documentation

## 2023-06-06 DIAGNOSIS — I10 Essential (primary) hypertension: Secondary | ICD-10-CM | POA: Diagnosis not present

## 2023-06-06 DIAGNOSIS — E119 Type 2 diabetes mellitus without complications: Secondary | ICD-10-CM | POA: Insufficient documentation

## 2023-06-06 DIAGNOSIS — Z01812 Encounter for preprocedural laboratory examination: Secondary | ICD-10-CM

## 2023-06-06 DIAGNOSIS — Z803 Family history of malignant neoplasm of breast: Secondary | ICD-10-CM | POA: Diagnosis not present

## 2023-06-06 HISTORY — PX: BREAST LUMPECTOMY WITH RADIO FREQUENCY LOCALIZER: SHX6897

## 2023-06-06 LAB — GLUCOSE, CAPILLARY
Glucose-Capillary: 147 mg/dL — ABNORMAL HIGH (ref 70–99)
Glucose-Capillary: 175 mg/dL — ABNORMAL HIGH (ref 70–99)

## 2023-06-06 LAB — COMPREHENSIVE METABOLIC PANEL
ALT: 16 U/L (ref 0–44)
AST: 16 U/L (ref 15–41)
Albumin: 4.3 g/dL (ref 3.5–5.0)
Alkaline Phosphatase: 80 U/L (ref 38–126)
Anion gap: 10 (ref 5–15)
BUN: 28 mg/dL — ABNORMAL HIGH (ref 8–23)
CO2: 28 mmol/L (ref 22–32)
Calcium: 10.4 mg/dL — ABNORMAL HIGH (ref 8.9–10.3)
Chloride: 104 mmol/L (ref 98–111)
Creatinine, Ser: 0.97 mg/dL (ref 0.44–1.00)
GFR, Estimated: >60 mL/min (ref >60)
Glucose, Bld: 158 mg/dL — ABNORMAL HIGH (ref 70–99)
Potassium: 4.2 mmol/L (ref 3.5–5.1)
Sodium: 142 mmol/L (ref 135–145)
Total Bilirubin: 1 mg/dL (ref 0.3–1.2)
Total Protein: 8.6 g/dL — ABNORMAL HIGH (ref 6.5–8.1)

## 2023-06-06 SURGERY — BREAST LUMPECTOMY WITH RADIO FREQUENCY LOCALIZER
Anesthesia: General

## 2023-06-06 MED ORDER — ONDANSETRON HCL 4 MG/2ML IJ SOLN
INTRAMUSCULAR | Status: DC | PRN
  Administered 2023-06-06: 4 mg via INTRAVENOUS

## 2023-06-06 MED ORDER — ONDANSETRON HCL 4 MG/2ML IJ SOLN
INTRAMUSCULAR | Status: AC
  Filled 2023-06-06: qty 2

## 2023-06-06 MED ORDER — CHLORHEXIDINE GLUCONATE CLOTH 2 % EX PADS
6.0000 | MEDICATED_PAD | Freq: Once | CUTANEOUS | Status: AC
  Administered 2023-06-06: 6 via TOPICAL

## 2023-06-06 MED ORDER — PROPOFOL 1000 MG/100ML IV EMUL
INTRAVENOUS | Status: AC
  Filled 2023-06-06: qty 100

## 2023-06-06 MED ORDER — ORAL CARE MOUTH RINSE: 15.0000 mL | Freq: Once | OROMUCOSAL | Status: AC

## 2023-06-06 MED ORDER — OXYCODONE HCL 5 MG PO TABS: 5.0000 mg | ORAL_TABLET | Freq: Once | ORAL | Status: DC | PRN

## 2023-06-06 MED ORDER — FENTANYL CITRATE (PF) 100 MCG/2ML IJ SOLN
INTRAMUSCULAR | Status: AC
  Filled 2023-06-06: qty 2

## 2023-06-06 MED ORDER — SUCCINYLCHOLINE CHLORIDE 200 MG/10ML IV SOSY
PREFILLED_SYRINGE | INTRAVENOUS | Status: DC | PRN
  Administered 2023-06-06: 100 mg via INTRAVENOUS

## 2023-06-06 MED ORDER — STERILE WATER FOR IRRIGATION IR SOLN
Status: DC | PRN
  Administered 2023-06-06: 200 mL

## 2023-06-06 MED ORDER — MIDAZOLAM HCL 5 MG/5ML IJ SOLN
INTRAMUSCULAR | Status: DC | PRN
  Administered 2023-06-06: 2 mg via INTRAVENOUS

## 2023-06-06 MED ORDER — GABAPENTIN 300 MG PO CAPS
ORAL_CAPSULE | ORAL | Status: AC
  Filled 2023-06-06: qty 1

## 2023-06-06 MED ORDER — LIDOCAINE HCL (CARDIAC) PF 100 MG/5ML IV SOSY
PREFILLED_SYRINGE | INTRAVENOUS | Status: DC | PRN
  Administered 2023-06-06: 100 mg via INTRAVENOUS

## 2023-06-06 MED ORDER — FENTANYL CITRATE (PF) 100 MCG/2ML IJ SOLN: 25.0000 ug | INTRAMUSCULAR | Status: DC | PRN

## 2023-06-06 MED ORDER — LIDOCAINE HCL (PF) 2 % IJ SOLN
INTRAMUSCULAR | Status: AC
  Filled 2023-06-06: qty 5

## 2023-06-06 MED ORDER — SODIUM CHLORIDE 0.9 % IV SOLN: INTRAVENOUS | Status: DC

## 2023-06-06 MED ORDER — OXYCODONE HCL 5 MG/5ML PO SOLN: 5.0000 mg | Freq: Once | ORAL | Status: DC | PRN

## 2023-06-06 MED ORDER — GABAPENTIN 300 MG PO CAPS
300.0000 mg | ORAL_CAPSULE | ORAL | Status: AC
  Administered 2023-06-06: 300 mg via ORAL

## 2023-06-06 MED ORDER — MIDAZOLAM HCL 2 MG/2ML IJ SOLN
INTRAMUSCULAR | Status: AC
  Filled 2023-06-06: qty 2

## 2023-06-06 MED ORDER — CEFAZOLIN SODIUM-DEXTROSE 2-4 GM/100ML-% IV SOLN
2.0000 g | INTRAVENOUS | Status: AC
  Administered 2023-06-06: 2 g via INTRAVENOUS

## 2023-06-06 MED ORDER — TECHNETIUM TC 99M TILMANOCEPT KIT
1.0400 | PACK | Freq: Once | INTRAVENOUS | Status: AC | PRN
  Administered 2023-06-06: 1.04 via INTRADERMAL

## 2023-06-06 MED ORDER — IBUPROFEN 800 MG PO TABS: 800.0000 mg | ORAL_TABLET | Freq: Three times a day (TID) | ORAL | 0 refills | Status: AC | PRN

## 2023-06-06 MED ORDER — BUPIVACAINE-EPINEPHRINE (PF) 0.25% -1:200000 IJ SOLN
INTRAMUSCULAR | Status: DC | PRN
  Administered 2023-06-06: 30 mL

## 2023-06-06 MED ORDER — FENTANYL CITRATE (PF) 100 MCG/2ML IJ SOLN
INTRAMUSCULAR | Status: DC | PRN
  Administered 2023-06-06 (×2): 50 ug via INTRAVENOUS

## 2023-06-06 MED ORDER — CELECOXIB 200 MG PO CAPS
200.0000 mg | ORAL_CAPSULE | ORAL | Status: AC
  Administered 2023-06-06: 200 mg via ORAL

## 2023-06-06 MED ORDER — HYDROCODONE-ACETAMINOPHEN 5-325 MG PO TABS: 1.0000 | ORAL_TABLET | Freq: Four times a day (QID) | ORAL | 0 refills | Status: AC | PRN

## 2023-06-06 MED ORDER — CELECOXIB 200 MG PO CAPS
ORAL_CAPSULE | ORAL | Status: AC
  Filled 2023-06-06: qty 1

## 2023-06-06 MED ORDER — PROPOFOL 10 MG/ML IV BOLUS
INTRAVENOUS | Status: AC
  Filled 2023-06-06: qty 20

## 2023-06-06 MED ORDER — SUCCINYLCHOLINE CHLORIDE 200 MG/10ML IV SOSY
PREFILLED_SYRINGE | INTRAVENOUS | Status: AC
  Filled 2023-06-06: qty 10

## 2023-06-06 MED ORDER — FAMOTIDINE 20 MG PO TABS
20.0000 mg | ORAL_TABLET | Freq: Once | ORAL | Status: AC
  Administered 2023-06-06: 20 mg via ORAL

## 2023-06-06 MED ORDER — ACETAMINOPHEN 500 MG PO TABS
ORAL_TABLET | ORAL | Status: AC
  Filled 2023-06-06: qty 2

## 2023-06-06 MED ORDER — CEFAZOLIN SODIUM-DEXTROSE 2-4 GM/100ML-% IV SOLN
INTRAVENOUS | Status: AC
  Filled 2023-06-06: qty 100

## 2023-06-06 MED ORDER — ACETAMINOPHEN 500 MG PO TABS
1000.0000 mg | ORAL_TABLET | ORAL | Status: AC
  Administered 2023-06-06: 1000 mg via ORAL

## 2023-06-06 MED ORDER — CHLORHEXIDINE GLUCONATE 0.12 % MT SOLN
OROMUCOSAL | Status: AC
  Filled 2023-06-06: qty 15

## 2023-06-06 MED ORDER — DEXAMETHASONE SODIUM PHOSPHATE 4 MG/ML IJ SOLN
INTRAMUSCULAR | Status: DC | PRN
  Administered 2023-06-06: 10 mg via INTRAVENOUS

## 2023-06-06 MED ORDER — DEXAMETHASONE SODIUM PHOSPHATE 10 MG/ML IJ SOLN
INTRAMUSCULAR | Status: AC
  Filled 2023-06-06: qty 1

## 2023-06-06 MED ORDER — BUPIVACAINE-EPINEPHRINE (PF) 0.25% -1:200000 IJ SOLN
INTRAMUSCULAR | Status: AC
  Filled 2023-06-06: qty 30

## 2023-06-06 MED ORDER — ISOSULFAN BLUE 1 % ~~LOC~~ SOLN
SUBCUTANEOUS | Status: AC
  Filled 2023-06-06: qty 5

## 2023-06-06 MED ORDER — PROPOFOL 10 MG/ML IV BOLUS
INTRAVENOUS | Status: DC | PRN
Start: 2023-06-06 — End: 2023-06-06
  Administered 2023-06-06: 50 ug/kg/min via INTRAVENOUS
  Administered 2023-06-06: 200 mg via INTRAVENOUS

## 2023-06-06 MED ORDER — BUPIVACAINE LIPOSOME 1.3 % IJ SUSP: 20.0000 mL | Freq: Once | INTRAMUSCULAR | Status: DC

## 2023-06-06 MED ORDER — FAMOTIDINE 20 MG PO TABS
ORAL_TABLET | ORAL | Status: AC
  Filled 2023-06-06: qty 1

## 2023-06-06 MED ORDER — CHLORHEXIDINE GLUCONATE 0.12 % MT SOLN
15.0000 mL | Freq: Once | OROMUCOSAL | Status: AC
  Administered 2023-06-06: 15 mL via OROMUCOSAL

## 2023-06-06 SURGICAL SUPPLY — 43 items
ADH SKN CLS APL DERMABOND .7 (GAUZE/BANDAGES/DRESSINGS) ×2
APL PRP STRL LF DISP 70% ISPRP (MISCELLANEOUS) ×2
APPLIER CLIP 9.375 SM OPEN (CLIP)
APR CLP SM 9.3 20 MLT OPN (CLIP)
BLADE SURG 15 STRL LF DISP TIS (BLADE) ×2 IMPLANT
BLADE SURG 15 STRL SS (BLADE) ×2
CHLORAPREP W/TINT 26 (MISCELLANEOUS) ×2 IMPLANT
CLIP APPLIE 9.375 SM OPEN (CLIP) IMPLANT
CNTNR URN SCR LID CUP LEK RST (MISCELLANEOUS) IMPLANT
CONT SPEC 4OZ STRL OR WHT (MISCELLANEOUS)
COVER PROBE GAMMA FINDER SLV (MISCELLANEOUS) ×2 IMPLANT
DERMABOND ADVANCED .7 DNX12 (GAUZE/BANDAGES/DRESSINGS) ×2 IMPLANT
DEVICE DUBIN SPECIMEN MAMMOGRA (MISCELLANEOUS) ×2 IMPLANT
DRAPE LAPAROTOMY TRNSV 106X77 (MISCELLANEOUS) ×2 IMPLANT
ELECT CAUTERY BLADE TIP 2.5 (TIP) ×2
ELECT REM PT RETURN 9FT ADLT (ELECTROSURGICAL) ×2
ELECTRODE CAUTERY BLDE TIP 2.5 (TIP) ×2 IMPLANT
ELECTRODE REM PT RTRN 9FT ADLT (ELECTROSURGICAL) ×2 IMPLANT
GAUZE 4X4 16PLY ~~LOC~~+RFID DBL (SPONGE) ×1 IMPLANT
GLOVE ORTHO TXT STRL SZ7.5 (GLOVE) ×4 IMPLANT
GOWN STRL REUS W/ TWL LRG LVL3 (GOWN DISPOSABLE) ×4 IMPLANT
GOWN STRL REUS W/ TWL XL LVL3 (GOWN DISPOSABLE) ×3 IMPLANT
GOWN STRL REUS W/TWL LRG LVL3 (GOWN DISPOSABLE) ×6
GOWN STRL REUS W/TWL XL LVL3 (GOWN DISPOSABLE) ×4
KIT MARKER MARGIN INK (KITS) ×1 IMPLANT
KIT TURNOVER KIT A (KITS) ×2 IMPLANT
MANIFOLD NEPTUNE II (INSTRUMENTS) ×2 IMPLANT
NDL HYPO 22X1.5 SAFETY MO (MISCELLANEOUS) ×1 IMPLANT
NEEDLE HYPO 22X1.5 SAFETY MO (MISCELLANEOUS) ×2 IMPLANT
PACK BASIN MINOR ARMC (MISCELLANEOUS) ×2 IMPLANT
SHEATH BREAST BIOPSY SKIN MKR (SHEATH) ×2 IMPLANT
SPIKE FLUID TRANSFER (MISCELLANEOUS) ×2 IMPLANT
SUT MNCRL 4-0 (SUTURE) ×2
SUT MNCRL 4-0 27XMFL (SUTURE) ×2
SUT VIC AB 3-0 SH 27 (SUTURE) ×2
SUT VIC AB 3-0 SH 27X BRD (SUTURE) ×2 IMPLANT
SUTURE MNCRL 4-0 27XMF (SUTURE) ×2 IMPLANT
SYR 10ML LL (SYRINGE) ×2 IMPLANT
SYR 20ML LL LF (SYRINGE) ×2 IMPLANT
TRAP FLUID SMOKE EVACUATOR (MISCELLANEOUS) ×2 IMPLANT
TRAP NEPTUNE SPECIMEN COLLECT (MISCELLANEOUS) ×2 IMPLANT
WATER STERILE IRR 1000ML POUR (IV SOLUTION) ×1 IMPLANT
WATER STERILE IRR 500ML POUR (IV SOLUTION) ×2 IMPLANT

## 2023-06-06 NOTE — Op Note (Signed)
  Pre-operative Diagnosis: Breast Cancer, DCIS left medial inferior breast.     Post-operative Diagnosis: Same  Surgeon: Campbell Lerner, M.D., FACS  Anesthesia: GETA  Procedure: Left Lumpectomy, SaviScout tag directed.  Procedure Details  The patient was seen again in the Holding Room. The benefits, complications, treatment options, and expected outcomes were discussed with the patient. The risks of bleeding, infection, recurrence of symptoms, failure to resolve symptoms, hematoma, seroma, open wound, cosmetic deformity, and the need for further surgery were discussed.  The patient was taken to Operating Room, identified as Deborah Castillo and the procedure verified.  A Time Out was held and the above information confirmed.  Despite her being previously injected for lymphoscintigraphy and anticipated sentinel lymph node biopsy, I elected to defer sentinel lymph node biopsy, considering her pathology and the likely unfavorable risk-benefit ratio to proceeding with an axillary wound in this patient.  So therefore I elected to forego proceeding with the sentinel lymph node biopsy.  Prior to the induction of general anesthesia, antibiotic prophylaxis was administered. VTE prophylaxis was in place. The patient was positioned in the supine position. Appropriate anesthesia was then administered and tolerated well. The [facility]  is used to mark the skin for incision.  The chest was prepped with Chloraprep and draped in the sterile fashion.  Attention was turned to the tag localization site where an incision was made for the bracketed excision. Dissection using the SaviScout to perform a lumpectomy with adequate margins was performed. This was done with electrocautery and sharp dissection with Mayo scissors. There was minimal bleeding, and the cavity packed.  The specimen was taken to the back table and painted to demarcate the 6 surfaces of potential margin.   I returned to the cavity to remove the  packing, and hemostasis was confirmed with electrocautery.   Once assuring that hemostasis was adequate and checked multiple times the wound was closed with interrupted 3-0 Vicryl followed by 4-0 subcuticular Monocryl sutures.  Dermabond is utilized to seal the incision.  A depot of local anesthesia of 0.25% Marcaine with epi is used.   Findings: Faxitron imaging: confirms presence of SaviScout markers.   Estimated Blood Loss: Minimal         Drains: None         Specimens:  Inferior medial left breast.        Complications: none.          Condition: Stable   Campbell Lerner, M.D., [facility]  06/06/2023 ; 11:46 AM

## 2023-06-06 NOTE — Transfer of Care (Signed)
Immediate Anesthesia Transfer of Care Note  Patient: Deborah Castillo  Procedure(s) Performed: BREAST LUMPECTOMY WITH RADIO FREQUENCY LOCALIZER  Patient Location: PACU  Anesthesia Type: General ETT  Level of Consciousness: awake, alert  and patient cooperative  Airway and Oxygen Therapy: Patient Spontanous Breathing and Patient connected to supplemental oxygen  Post-op Assessment: Post-op Vital signs reviewed, Patient's Cardiovascular Status Stable, Respiratory Function Stable, Patent Airway and No signs of Nausea or vomiting  Post-op Vital Signs: Reviewed and stable  Complications: There were no known notable events for this encounter.

## 2023-06-06 NOTE — Interval H&P Note (Signed)
History and Physical Interval Note:  06/06/2023 9:26 AM  [name]  has presented today for surgery, with the diagnosis of DCIS left breast D05.12.  The various methods of treatment have been discussed with the patient and family. After consideration of risks, benefits and other options for treatment, the patient has consented to  Procedure(s): BREAST LUMPECTOMY,RADIO FREQ LOCALIZER,AXILLARY SENTINEL LYMPH NODE BIOPSY (Left) as a surgical intervention.  [name] history has been reviewed, patient examined, no change in status, stable for surgery.  I have reviewed the patient's chart and labs.  Questions were answered to the patient's satisfaction.    The left upper breast is marked.   Deborah Castillo

## 2023-06-06 NOTE — Anesthesia Postprocedure Evaluation (Signed)
Anesthesia Post Note  Patient: Deborah Castillo  Procedure(s) Performed: BREAST LUMPECTOMY WITH RADIO FREQUENCY LOCALIZER  Patient location during evaluation: PACU Anesthesia Type: General Level of consciousness: awake and alert Pain management: pain level controlled Vital Signs Assessment: post-procedure vital signs reviewed and stable Respiratory status: spontaneous breathing, nonlabored ventilation, respiratory function stable and patient connected to nasal cannula oxygen Cardiovascular status: blood pressure returned to baseline and stable Postop Assessment: no apparent nausea or vomiting Anesthetic complications: no   There were no known notable events for this encounter.   Last Vitals:  Vitals:   06/06/23 1215 06/06/23 1240  BP: (!) 176/82 (!) 142/84  Pulse:  66  Resp: 17 18  Temp:  (!) 36.1 C  SpO2: 98% 98%    Last Pain:  Vitals:   06/06/23 1240  TempSrc: Temporal  PainSc: 0-No pain                 Cleda Mccreedy 

## 2023-06-06 NOTE — Discharge Instructions (Signed)

## 2023-06-06 NOTE — Anesthesia Preprocedure Evaluation (Addendum)
Anesthesia Evaluation  Patient identified by MRN, date of birth, ID band Patient awake    Reviewed: Allergy & Precautions, NPO status , Patient's Chart, lab work & pertinent test results  History of Anesthesia Complications (+) DIFFICULT IV STICK / SPECIAL LINE and history of anesthetic complications  Airway Mallampati: III  TM Distance: <3 FB Neck ROM: full    Dental  (+) Chipped, Poor Dentition, Missing, Caps   Pulmonary asthma    Pulmonary exam normal        Cardiovascular hypertension, (-) angina Normal cardiovascular exam     Neuro/Psych  PSYCHIATRIC DISORDERS      negative neurological ROS     GI/Hepatic negative GI ROS, Neg liver ROS,neg GERD  ,,  Endo/Other  diabetes, Type 2    Renal/GU      Musculoskeletal   Abdominal   Peds  Hematology negative hematology ROS (+)   Anesthesia Other Findings Past Medical History: No date: Asthma No date: B12 deficiency 2014: Breast cancer (HCC)     Comment:  with radiation No date: Cataract No date: Ductal carcinoma in situ (DCIS) of left breast No date: Family history of iron deficiency anemia No date: Hypertension No date: Malignant neoplasm of female breast (HCC) No date: Mental retardation No date: Mixed hyperlipidemia No date: Morbid obesity with BMI of 50.0-59.9, adult (HCC) No date: Onychomycosis due to dermatophyte 2005: Open fracture of sesamoid bone of right foot No date: Personal history of radiation therapy No date: Schizophrenia (HCC) No date: Type 2 diabetes mellitus with complication (HCC)  Past Surgical History: No date: ABDOMINAL HYSTERECTOMY 2014: BREAST BIOPSY; Right     Comment:  positive 04/25/2023: BREAST BIOPSY; Left     Comment:  Left Breast Stereo Bx, coil clip - HIGH-GRADE DUCTAL               CARCINOMA IN SITU 04/25/2023: BREAST BIOPSY; Left     Comment:  MM LT BREAST BX W LOC DEV 1ST LESION IMAGE BX SPEC               STEREO  GUIDE 04/25/2023 [facility] 05/23/2023: BREAST BIOPSY     Comment:  MM LT RADIO FREQUENCY TAG EA ADD LESION LOC MAMMO GUIDE               05/23/2023 [facility] 2014: BREAST LUMPECTOMY; Right     Comment:  INVASIVE MAMMARY CARCINOMA  BMI    Body Mass Index: 51.37 kg/m      Reproductive/Obstetrics negative OB ROS                             Anesthesia Physical Anesthesia Plan  ASA: 3  Anesthesia Plan: General ETT   Post-op Pain Management:    Induction: Intravenous  PONV Risk Score and Plan: Ondansetron, Dexamethasone, Midazolam and Treatment may vary due to age or medical condition  Airway Management Planned: Oral ETT and Video Laryngoscope Planned  Additional Equipment:   Intra-op Plan:   Post-operative Plan: Extubation in OR  Informed Consent: I have reviewed the patients History and Physical, chart, labs and discussed the procedure including the risks, benefits and alternatives for the proposed anesthesia with the patient or authorized representative who has indicated his/her understanding and acceptance.     Dental Advisory Given  Plan Discussed with: Anesthesiologist, CRNA and Surgeon  Anesthesia Plan Comments: (Legal guardian, Anselmo Pickler, and patient consented for risks of anesthesia including but not limited to:  -  adverse reactions to medications - damage to eyes, teeth, lips or other oral mucosa - nerve damage due to positioning  - sore throat or hoarseness - Damage to heart, brain, nerves, lungs, other parts of body or loss of life  They voiced understanding.)       Anesthesia Quick Evaluation

## 2023-06-06 NOTE — Anesthesia Procedure Notes (Signed)
Procedure Name: Intubation Date/Time: 06/06/2023 10:03 AM  Performed by: Lysbeth Penner, CRNAPre-anesthesia Checklist: Patient identified, Emergency Drugs available, Suction available and Patient being monitored Patient Re-evaluated:Patient Re-evaluated prior to induction Oxygen Delivery Method: Circle system utilized Preoxygenation: Pre-oxygenation with 100% oxygen Induction Type: IV induction Ventilation: Mask ventilation without difficulty Laryngoscope Size: McGraph and 3 Grade View: Grade I Tube type: Oral Tube size: 6.5 mm Number of attempts: 1 Airway Equipment and Method: Stylet and Oral airway Placement Confirmation: ETT inserted through vocal cords under direct vision, positive ETCO2 and breath sounds checked- equal and bilateral Tube secured with: Tape Dental Injury: Teeth and Oropharynx as per pre-operative assessment

## 2023-06-07 ENCOUNTER — Encounter: Payer: Self-pay | Admitting: Surgery

## 2023-06-11 ENCOUNTER — Other Ambulatory Visit: Payer: Self-pay | Admitting: Pathology

## 2023-06-19 ENCOUNTER — Ambulatory Visit (INDEPENDENT_AMBULATORY_CARE_PROVIDER_SITE_OTHER): Payer: Medicare Other | Admitting: Surgery

## 2023-06-19 ENCOUNTER — Encounter: Payer: Self-pay | Admitting: Surgery

## 2023-06-19 VITALS — BP 122/79 | HR 72 | Temp 98.0°F | Ht 63.0 in | Wt 286.0 lb

## 2023-06-19 DIAGNOSIS — C50912 Malignant neoplasm of unspecified site of left female breast: Secondary | ICD-10-CM

## 2023-06-19 DIAGNOSIS — Z08 Encounter for follow-up examination after completed treatment for malignant neoplasm: Secondary | ICD-10-CM

## 2023-06-19 NOTE — Progress Notes (Unsigned)
[facility] POST-OP OFFICE VISIT  06/19/2023  HPI: Deborah Castillo is a 62 y.o. female 13 days s/p excision of bracketed left breast tissue for DCIS.  She presents today with no complaints.  Diagnosis Breast, lumpectomy, left, inferior medial quadrant - MULTIFOCAL MICROINVASIVE MAMMARY CARCINOMA ARISING IN A BACKGROUND OF EXTENSIVE HIGH-GRADE DUCTAL CARCINOMA IN SITU (DCIS). - SEE CANCER SUMMARY BELOW. - BIOPSY SITE CHANGE WITH TWO SCOUT TAGS. Microscopic Comment CANCER CASE SUMMARY: INVASIVE CARCINOMA OF THE BREAST Standard(s): AJCC-UICC 8 SPECIMEN Procedure: Lumpectomy Specimen Laterality: Left TUMOR Histologic Type: Microinvasive carcinoma Histologic Grade (Nottingham Histologic Score): Not applicable (microinvasion only) Tumor Size: Greatest dimension of largest invasive focus: Microinvasion only (less than or equal to 1 mm) Ductal Carcinoma In Situ (DCIS): Present, high-grade with comedonecrosis Positive for extensive intraductal component (EIC) Tumor Extent: Not applicable Lymphatic and/or Vascular Invasion: Not identified Treatment Effect in the Breast: No known presurgical therapy MARGINS Margin Status for Invasive Carcinoma: All margins negative for invasive carcinoma Distance from closest margin: Less than 1 mm Specify closest margin: Anterior Margin Status for DCIS: All margins negative for DCIS Distance from DCIS to closest margin: Less than 1 mm Specify closest margin: Anterior and inferior  SPECIAL STUDIES Breast Biomarker Testing will be performed on largest focus of microinvasive carcinoma (block 1C) and will be reported in an addendum.  Vital signs: Ht 5\' 3"  (1.6 m)   Wt 286 lb (129.7 kg)   BMI 50.66 kg/m    Physical Exam: Constitutional: She appears well, at her baseline. Breast: Medial incision appears to be healing.  Consider that pedunculated nature of her breast, no surprise the way her scar appears.  No evidence of  erythema, edema or hematoma.    Assessment/Plan: This is a 62 y.o. female 13 days s/p Scout tag bracketed left breast lumpectomy for DCIS.  Pathology notes microinvasive disease.  With <1 mm margins.  Patient Active Problem List   Diagnosis Date Noted   Ductal carcinoma in situ (DCIS) of left breast 05/01/2023   Hyperlipidemia due to type 2 diabetes mellitus (HCC) 02/21/2022   Asthma in adult 07/25/2017   Cataract 07/25/2017   Onychomycosis due to dermatophyte 07/25/2017   Diabetes mellitus type 2, uncontrolled 07/25/2017   H/O iron deficiency anemia 07/25/2017   Hypertension 07/25/2017   Mental retardation 07/25/2017   Mixed hyperlipidemia 07/25/2017   Personal history of breast cancer 07/25/2017   Schizophrenia (HCC) 07/25/2017   Type 2 diabetes mellitus with complication (HCC) 07/25/2017   Vitamin B12 deficiency 07/25/2017   Morbid obesity with BMI of 50.0-59.9, adult (HCC) 02/21/2017   Malignant neoplasm of female breast (HCC) 05/24/2015    -She will be following up with Dr. Orlie Dakin and Dr. Rushie Chestnut.  Considering this patient's comorbidities of morbid obesity and her social status, have concerns regarding more aggressive/repeat surgery.  I elected preoperatively not to pursue sentinel lymph node, with the rare likelihood of it being positive.  Will have her follow-up as needed for any further discussion of additional surgical intervention, hopefully this can be managed with radiation and hormonal blockade for risk reduction considering these complicating features.   Campbell Lerner M.D., FACS 06/19/2023, 1:36 PM

## 2023-06-19 NOTE — Patient Instructions (Addendum)
Follow up with Dr Orlie Dakin and Dr Rushie Chestnut tomorrow.   We will follow up based on what those doctors decide.   Lumpectomy, Care After The following information offers guidance on how to care for yourself after your procedure. Your health care provider may also give you more specific instructions. If you have problems or questions, contact your health care provider. What can I expect after the procedure? After the procedure, it is common to have: Some pain or redness at the incision site. Breast swelling. Breast tenderness. Stiffness in your arm or shoulder. A change in the shape and feel of your breast. Scar tissue that feels hard to the touch in the area where the lump was removed. Follow these instructions at home: Medicines Take over-the-counter and prescription medicines only as told by your health care provider. If you were prescribed an antibiotic, take it as told by your health care provider. Do not stop taking the antibiotic even if you start to feel better. Ask your health care provider if the medicine prescribed to you: Requires you to avoid driving or using machinery. Can cause constipation. You may need to take these actions to prevent or treat constipation: Drink enough fluid to keep your urine pale yellow. Take over-the-counter or prescription medicines. Eat foods that are high in fiber, such as beans, whole grains, and fresh fruits and vegetables. Limit foods that are high in fat and processed sugars, such as fried or sweet foods. Incision care     Follow instructions from your health care provider about how to take care of your incision. Make sure you: Wash your hands with soap and water for at least 20 seconds before and after you change your bandage (dressing). If soap and water are not available, use hand sanitizer. Change your dressing as told by your health care provider. Leave stitches (sutures), skin glue, or adhesive strips in place. These skin closures may  need to stay in place for 2 weeks or longer. If adhesive strip edges start to loosen and curl up, you may trim the loose edges. Do not remove adhesive strips completely unless your health care provider tells you to do that. Check your incision area every day for signs of infection. Check for: More redness, swelling, or pain. Fluid or blood. Warmth. Pus or a bad smell. Keep your dressing clean and dry. If you were sent home with a surgical drain in place, follow instructions from your health care provider about emptying it. Bathing Do not take baths, swim, or use a hot tub until your health care provider approves. Ask your health care provider if you may take showers. You may only be allowed to take sponge baths. Activity Rest as told by your health care provider. Do not sit for a long time without moving. Get up to take short walks every 1-2 hours. This will improve blood flow and breathing. Ask for help if you feel weak or unsteady. Be careful to avoid any activities that could cause an injury to your arm on the side of your surgery. Do not lift anything that is heavier than 10 lb (4.5 kg), or the limit that you are told, until your health care provider says that it is safe. Avoid lifting with the arm that is on the side of your surgery. Do not carry heavy objects on your shoulder on the side of your surgery. Do exercises to keep your shoulder and arm from getting stiff and swollen. Talk with your health care provider about which exercises  are safe for you. Return to your normal activities as told by your health care provider. Ask your health care provider what activities are safe for you. General instructions Wear a supportive bra as told by your health care provider. Raise (elevate) your arm above the level of your heart while you are sitting or lying down. Do not wear tight jewelry on your arm, wrist, or fingers on the side of your surgery. Wear compression stockings as told by your health  care provider. These stockings help to prevent blood clots and reduce swelling in your legs. If you had any lymph nodes removed during your procedure, be sure to tell all of your health care providers. It is important to share this information before you have certain procedures, such as blood tests or blood pressure measurements. Keep all follow-up visits. You may need to be screened for extra fluid around the lymph nodes and swelling in the breast and arm (lymphedema). Contact a health care provider if: You develop a rash. You have a fever. Your pain worsens or pain medicine is not working. You have swelling, weakness, or numbness in your arm that does not improve after a few weeks. You have new swelling in your breast. You have any of these signs of infection: More redness, swelling, or pain in your incision area. Fluid or blood coming from your incision. Warmth coming from the incision area. Pus or a bad smell coming from your incision. Get help right away if: You have very bad pain in your breast or arm. You have swelling in your legs or arms. You have redness, warmth, or pain in your leg or arm. You have chest pain. You have difficulty breathing. These symptoms may be an emergency. Get help right away. Call 911. Do not wait to see if the symptoms will go away. Do not drive yourself to the hospital. Summary After the procedure, it is common to have breast tenderness, swelling in your breast, and stiffness in your arm and shoulder. Follow instructions from your health care provider about how to take care of your incision. Do not lift anything that is heavier than 10 lb (4.5 kg), or the limit that you are told, until your health care provider says that it is safe. Avoid lifting with the arm that is on the side of your surgery. If you had any lymph nodes removed during your procedure, be sure to tell all of your health care providers. This information is not intended to replace advice  given to you by your health care provider. Make sure you discuss any questions you have with your health care provider. Document Revised: 12/25/2021 Document Reviewed: 12/25/2021 Elsevier Patient Education  2024 [employer].

## 2023-06-20 ENCOUNTER — Ambulatory Visit
Admission: RE | Admit: 2023-06-20 | Discharge: 2023-06-20 | Disposition: A | Discharge status: Home / Self Care | Payer: Medicare Other | Source: Ambulatory Visit | Attending: Radiation Oncology | Admitting: Radiation Oncology

## 2023-06-20 ENCOUNTER — Inpatient Hospital Stay: Payer: Medicare Other | Attending: Oncology | Admitting: Oncology

## 2023-06-20 VITALS — BP 140/79 | HR 64 | Temp 96.8°F | Wt 288.0 lb

## 2023-06-20 DIAGNOSIS — C50211 Malignant neoplasm of upper-inner quadrant of right female breast: Secondary | ICD-10-CM | POA: Insufficient documentation

## 2023-06-20 DIAGNOSIS — Z170 Estrogen receptor positive status [ER+]: Secondary | ICD-10-CM | POA: Insufficient documentation

## 2023-06-20 DIAGNOSIS — D0512 Intraductal carcinoma in situ of left breast: Secondary | ICD-10-CM | POA: Diagnosis not present

## 2023-06-20 DIAGNOSIS — Z79811 Long term (current) use of aromatase inhibitors: Secondary | ICD-10-CM | POA: Insufficient documentation

## 2023-06-20 DIAGNOSIS — Z09 Encounter for follow-up examination after completed treatment for conditions other than malignant neoplasm: Secondary | ICD-10-CM

## 2023-06-20 NOTE — Consult Note (Signed)
NEW PATIENT EVALUATION  Name: Deborah Castillo  MRN: [id number]  Date:   06/20/2023     DOB: 10-10-1961   This 62 y.o. 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  She now presented with at least a 2.5 cm region of indeterminate calcifications of the left lower inner quadrant the breast.  She underwent ultrasound-guided biopsy showing high-grade ductal carcinoma in situ with necrosis measuring up to 5 mm.  She went on to have a wide local excision of left breast showed multifocal microinvasive mammary carcinoma arising the background of extensive high-grade DCIS.  Margins were clear for microinvasive component as well as DCIS component.  Margins were less than 1 mm for both invasive and DCIS component.  No lymph nodes were sampled.  Tumor was ER/PR negative.  She is seen today for radiation oncology consultation.  She is without complaint.  She is extremely large pendulous breasts.  PLANNED TREATMENT REGIMEN: PBI  PAST MEDICAL HISTORY:  has a past medical history of Asthma, B12 deficiency, Breast cancer (HCC) (2014), Cataract, Ductal carcinoma in situ (DCIS) of left breast, Family history of iron deficiency anemia, Hypertension, Malignant neoplasm of female breast (HCC), Mental retardation, Mixed hyperlipidemia, Morbid  obesity with BMI of 50.0-59.9, adult (HCC), Onychomycosis due to dermatophyte, Open fracture of sesamoid bone of right foot (2005), Personal history of radiation therapy, Schizophrenia (HCC), and Type 2 diabetes mellitus with complication (HCC).    PAST SURGICAL HISTORY:  Past Surgical History:  Procedure Laterality Date   ABDOMINAL HYSTERECTOMY     BREAST BIOPSY Right 2014   positive   BREAST BIOPSY Left 04/25/2023   Left Breast Stereo Bx, coil clip - HIGH-GRADE DUCTAL CARCINOMA IN SITU   BREAST BIOPSY Left 04/25/2023   MM LT BREAST BX W LOC DEV 1ST LESION IMAGE BX SPEC STEREO GUIDE 04/25/2023 [facility]   BREAST BIOPSY  05/23/2023   MM LT RADIO FREQUENCY TAG EA ADD LESION LOC MAMMO GUIDE 05/23/2023 [facility]   BREAST LUMPECTOMY Right 2014   INVASIVE MAMMARY CARCINOMA   BREAST LUMPECTOMY WITH RADIO FREQUENCY LOCALIZER  06/06/2023   Procedure: BREAST LUMPECTOMY WITH RADIO FREQUENCY LOCALIZER;  Surgeon: Campbell Lerner, MD;  Location: [facility];  Service: General;;    FAMILY HISTORY: family history is not on file.  SOCIAL HISTORY:  reports that she has never smoked. She has never been exposed to tobacco smoke. She has never used smokeless tobacco. She reports that she does not drink alcohol and does not use drugs.  ALLERGIES: Patient has no known allergies.  MEDICATIONS:  Current Outpatient Medications  Medication Sig Dispense Refill   amLODipine (NORVASC) 10 MG tablet Take 10 mg by mouth daily.     atorvastatin (LIPITOR) 20 MG tablet Take 20 mg by mouth at bedtime.     cloNIDine (CATAPRES) 0.3 MG tablet Take 0.3 mg by mouth 3 (three) times daily.  docusate sodium (COLACE) 100 MG capsule Take 100 mg by mouth 2 (two) times daily.     empagliflozin (JARDIANCE) 10 MG TABS tablet Take 10 mg by mouth daily.     fluPHENAZine (PROLIXIN) 5 MG tablet Take 5 mg by mouth at bedtime.     fluticasone (FLONASE) 50 MCG/ACT nasal spray Place 2 sprays into both nostrils daily.      furosemide (LASIX) 20 MG tablet Take 20 mg by mouth daily.     gabapentin (NEURONTIN) 100 MG capsule Take 100 mg by mouth 2 (two) times daily.     glimepiride (AMARYL) 4 MG tablet Take 4 mg by mouth daily with breakfast.     hydrochlorothiazide (HYDRODIURIL) 25 MG tablet Take 25 mg by mouth daily.     HYDROcodone-acetaminophen (NORCO/VICODIN) 5-325 MG tablet Take 1 tablet by mouth every 6 (six) hours as needed for moderate pain. 15 tablet 0   ibuprofen (ADVIL) 800 MG tablet Take 1 tablet (800 mg total) by mouth every 8 (eight) hours as needed. 30 tablet 0   isosorbide mononitrate (IMDUR) 120 MG 24 hr tablet Take 240 mg by mouth daily.     ketoconazole (NIZORAL) 2 % cream SMARTSIG:1 Application Topical 1 to 2 Times Daily     ketorolac (ACULAR) 0.5 % ophthalmic solution Place 1 drop into both eyes daily.     lactulose (CHRONULAC) 10 GM/15ML solution Take 30 mLs (20 g total) by mouth daily as needed for mild constipation. 120 mL 0   letrozole (FEMARA) 2.5 MG tablet Take 1 tablet (2.5 mg total) by mouth daily. 90 tablet 3   linagliptin (TRADJENTA) 5 MG TABS tablet Take 5 mg by mouth daily.     LORazepam (ATIVAN) 0.5 MG tablet Take 0.5 mg by mouth 2 (two) times daily as needed.     losartan (COZAAR) 100 MG tablet Take 100 mg by mouth daily.     metFORMIN (GLUCOPHAGE) 1000 MG tablet Take 1,000 mg by mouth 2 (two) times daily with a meal.     metoprolol tartrate (LOPRESSOR) 100 MG tablet Take 100 mg by mouth 2 (two) times daily.     omega-3 fish oil (MAXEPA) 1000 MG CAPS capsule Take 1 capsule by mouth daily.     pioglitazone (ACTOS) 15 MG tablet Take 15 mg by mouth daily.     vitamin B-12 (CYANOCOBALAMIN) 100 MCG tablet Take 100 mcg by mouth daily.     No current facility-administered medications for this encounter.    ECOG PERFORMANCE STATUS:  0 - Asymptomatic  REVIEW OF SYSTEMS: Patient denies any weight loss, fatigue, weakness, fever, chills or night sweats. Patient denies any loss of vision,  blurred vision. Patient denies any ringing  of the ears or hearing loss. No irregular heartbeat. Patient denies heart murmur or history of fainting. Patient denies any chest pain or pain radiating to her upper extremities. Patient denies any shortness of breath, difficulty breathing at night, cough or hemoptysis. Patient denies any swelling in the lower legs. Patient denies any nausea vomiting, vomiting of blood, or coffee ground material in the vomitus. Patient denies any stomach pain. Patient states has had normal bowel movements no significant constipation or diarrhea. Patient denies any dysuria, hematuria or significant nocturia. Patient denies any problems walking, swelling in the joints or loss of balance. Patient denies any skin changes, loss of hair or loss of weight. Patient denies any excessive worrying or anxiety or significant depression. Patient denies any problems with insomnia. Patient denies excessive thirst, polyuria,  polydipsia. Patient denies any swollen glands, patient denies easy bruising or easy bleeding. Patient denies any recent infections, allergies or URI. Patient "s visual fields have not changed significantly in recent time.   PHYSICAL EXAM: There were no vitals taken for this visit. Patient's right breast is somewhat shrunken from prior whole breast radiation.  Left breast has a wide local excision scar which is healing well.  Both breasts are large and pendulous.  No axillary or supraclavicular adenopathy is appreciated.  Patient is morbidly obese.  Well-developed well-nourished patient in NAD. HEENT reveals PERLA, EOMI, discs not visualized.  Oral cavity is clear. No oral mucosal lesions are identified. Neck is clear without evidence of cervical or supraclavicular adenopathy. Lungs are clear to A&P. Cardiac examination is essentially unremarkable with regular rate and rhythm without murmur rub or thrill. Abdomen is benign with no organomegaly or masses noted. Motor sensory and DTR  levels are equal and symmetric in the upper and lower extremities. Cranial nerves II through XII are grossly intact. Proprioception is intact. No peripheral adenopathy or edema is identified. No motor or sensory levels are noted. Crude visual fields are within normal range.  LABORATORY DATA: Pathology reports reviewed    RADIOLOGY RESULTS: Mammogram ultrasound reviewed compatible with above-stated findings   IMPRESSION: Microinvasive mammary carcinoma ER/PR negative status post wide local excision of the left breast in 62 year old female with prior history of right breast cancer  PLAN: At this time based on extreme large size of her breasts have opted to go ahead with PBI.  Would plan on delivering 35 Gray in 10 fractions using IMRT treatment planning and delivery.  Risks and benefits of treatment including skin reaction fatigue partial thickening of the breast all were discussed in detail with the patient.  She comprehends her recommendations well.  I have set her up early next week for simulation.  I would like to take this opportunity to thank you for allowing me to participate in the care of your patient.Carmina Miller, MD

## 2023-06-20 NOTE — Progress Notes (Unsigned)
Patient is having issues with her feet falling asleep when sitting or lying in bed.

## 2023-06-20 NOTE — Progress Notes (Unsigned)
[facility]  [phone] [phone]) [phone]  ID: Deborah Castillo OB: January 11, 1961  MR#: [id number]  AOZ#:[id number]  Patient Care Team: Koren Bound, NP as PCP - General (Nurse Practitioner)  CHIEF COMPLAINT: History of stage IIa ER/PR positive HER-2 negative adenocarcinoma of the upper inner quadrant of right breast now with ER/PR negative DCIS of the left breast.  INTERVAL HISTORY: Patient returns to clinic today for further evaluation, discussion of her biopsy results, and treatment planning.  She now lives in a group home and has a legal guardian.  She currently feels well.  She continues to tolerate letrozole without significant side effects.  She has no neurologic complaints.  She denies any recent fevers or illnesses.  She has a good appetite.  She denies any chest pain, shortness of breath, cough, or hemoptysis.  She denies any nausea, vomiting, constipation, or diarrhea.  She has no urinary complaints.  Patient offers no further specific complaints today.  REVIEW OF SYSTEMS:   Review of Systems  Constitutional: Negative.  Negative for fever, malaise/fatigue and weight loss.  HENT:  Negative for sore throat.   Respiratory: Negative.  Negative for cough and shortness of breath.   Cardiovascular: Negative.  Negative for chest pain and leg swelling.  Gastrointestinal: Negative.  Negative for abdominal pain.  Genitourinary: Negative.  Negative for flank pain.  Musculoskeletal: Negative.  Negative for back pain.  Skin: Negative.  Negative for rash.  Neurological: Negative.  Negative for dizziness, focal weakness, weakness and headaches.  Psychiatric/Behavioral: Negative.  The patient is not nervous/anxious.     As per HPI. Otherwise, a complete review of systems is negative.  PAST MEDICAL HISTORY: Past Medical History:  Diagnosis Date  . Asthma   . B12 deficiency   . Breast cancer (HCC) 2014   with radiation  . Cataract   . Ductal carcinoma in  situ (DCIS) of left breast   . Family history of iron deficiency anemia   . Hypertension   . Malignant neoplasm of female breast (HCC)   . Mental retardation   . Mixed hyperlipidemia   . Morbid obesity with BMI of 50.0-59.9, adult (HCC)   . Onychomycosis due to dermatophyte   . Open fracture of sesamoid bone of right foot 2005  . Personal history of radiation therapy   . Schizophrenia (HCC)   . Type 2 diabetes mellitus with complication (HCC)     PAST SURGICAL HISTORY: Past Surgical History:  Procedure Laterality Date  . ABDOMINAL HYSTERECTOMY    . BREAST BIOPSY Right 2014   positive  . BREAST BIOPSY Left 04/25/2023   Left Breast Stereo Bx, coil clip - HIGH-GRADE DUCTAL CARCINOMA IN SITU  . BREAST BIOPSY Left 04/25/2023   MM LT BREAST BX W LOC DEV 1ST LESION IMAGE BX SPEC STEREO GUIDE 04/25/2023 [facility]  . BREAST BIOPSY  05/23/2023   MM LT RADIO FREQUENCY TAG EA ADD LESION LOC MAMMO GUIDE 05/23/2023 [facility]  . BREAST LUMPECTOMY Right 2014   INVASIVE MAMMARY CARCINOMA  . BREAST LUMPECTOMY WITH RADIO FREQUENCY LOCALIZER  06/06/2023   Procedure: BREAST LUMPECTOMY WITH RADIO FREQUENCY LOCALIZER;  Surgeon: Campbell Lerner, MD;  Location: [facility];  Service: General;;    FAMILY HISTORY:  Diabetes and hypertension.     ADVANCED DIRECTIVES:    HEALTH MAINTENANCE: Social History   Tobacco Use  . Smoking status: Never    Passive exposure: Never  . Smokeless tobacco: Never  Vaping Use  . Vaping status: Never Used  Substance  Use Topics  . Alcohol use: No  . Drug use: No     Colonoscopy:  PAP:  Bone density:  Lipid panel:  No Known Allergies  Current Outpatient Medications  Medication Sig Dispense Refill  . amLODipine (NORVASC) 10 MG tablet Take 10 mg by mouth daily.    Marland Kitchen atorvastatin (LIPITOR) 20 MG tablet Take 20 mg by mouth at bedtime.    . cloNIDine (CATAPRES) 0.3 MG tablet Take 0.3 mg by mouth 3 (three) times daily.    Marland Kitchen docusate sodium (COLACE)  100 MG capsule Take 100 mg by mouth 2 (two) times daily.    . empagliflozin (JARDIANCE) 10 MG TABS tablet Take 10 mg by mouth daily.    . fluPHENAZine (PROLIXIN) 5 MG tablet Take 5 mg by mouth at bedtime.    . fluticasone (FLONASE) 50 MCG/ACT nasal spray Place 2 sprays into both nostrils daily.    . furosemide (LASIX) 20 MG tablet Take 20 mg by mouth daily.    Marland Kitchen gabapentin (NEURONTIN) 100 MG capsule Take 100 mg by mouth 2 (two) times daily.    Marland Kitchen glimepiride (AMARYL) 4 MG tablet Take 4 mg by mouth daily with breakfast.    . hydrochlorothiazide (HYDRODIURIL) 25 MG tablet Take 25 mg by mouth daily.    Marland Kitchen HYDROcodone-acetaminophen (NORCO/VICODIN) 5-325 MG tablet Take 1 tablet by mouth every 6 (six) hours as needed for moderate pain. 15 tablet 0  . ibuprofen (ADVIL) 800 MG tablet Take 1 tablet (800 mg total) by mouth every 8 (eight) hours as needed. 30 tablet 0  . isosorbide mononitrate (IMDUR) 120 MG 24 hr tablet Take 240 mg by mouth daily.    Marland Kitchen ketoconazole (NIZORAL) 2 % cream SMARTSIG:1 Application Topical 1 to 2 Times Daily    . ketorolac (ACULAR) 0.5 % ophthalmic solution Place 1 drop into both eyes daily.    Marland Kitchen lactulose (CHRONULAC) 10 GM/15ML solution Take 30 mLs (20 g total) by mouth daily as needed for mild constipation. 120 mL 0  . letrozole (FEMARA) 2.5 MG tablet Take 1 tablet (2.5 mg total) by mouth daily. 90 tablet 3  . linagliptin (TRADJENTA) 5 MG TABS tablet Take 5 mg by mouth daily.    Marland Kitchen LORazepam (ATIVAN) 0.5 MG tablet Take 0.5 mg by mouth 2 (two) times daily as needed.    Marland Kitchen losartan (COZAAR) 100 MG tablet Take 100 mg by mouth daily.    . metFORMIN (GLUCOPHAGE) 1000 MG tablet Take 1,000 mg by mouth 2 (two) times daily with a meal.    . metoprolol tartrate (LOPRESSOR) 100 MG tablet Take 100 mg by mouth 2 (two) times daily.    Marland Kitchen omega-3 fish oil (MAXEPA) 1000 MG CAPS capsule Take 1 capsule by mouth daily.    . pioglitazone (ACTOS) 15 MG tablet Take 15 mg by mouth daily.    . vitamin  B-12 (CYANOCOBALAMIN) 100 MCG tablet Take 100 mcg by mouth daily.     No current facility-administered medications for this visit.    OBJECTIVE: Vitals:   06/20/23 1057  BP: (!) 140/79  Pulse: 64  Temp: (!) 96.8 F (36 C)  SpO2: 96%     Body mass index is 51.02 kg/m.    ECOG FS:0 - Asymptomatic  General: Well-developed, well-nourished, no acute distress. Eyes: Pink conjunctiva, anicteric sclera. HEENT: Normocephalic, moist mucous membranes. Breasts: Exam deferred today. Lungs: No audible wheezing or coughing. Heart: Regular rate and rhythm. Abdomen: Soft, nontender, no obvious distention. Musculoskeletal: No edema, cyanosis, or  clubbing. Neuro: Alert, answering all questions appropriately. Cranial nerves grossly intact. Skin: No rashes or petechiae noted. Psych: Normal affect.   LAB RESULTS:  Lab Results  Component Value Date   NA 142 06/06/2023   K 4.2 06/06/2023   CL 104 06/06/2023   CO2 28 06/06/2023   GLUCOSE 158 (H) 06/06/2023   BUN 28 (H) 06/06/2023   CREATININE 0.97 06/06/2023   CALCIUM 10.4 (H) 06/06/2023   PROT 8.6 (H) 06/06/2023   ALBUMIN 4.3 06/06/2023   AST 16 06/06/2023   ALT 16 06/06/2023   ALKPHOS 80 06/06/2023   BILITOT 1.0 06/06/2023   GFRNONAA >60 06/06/2023   GFRAA >60 07/15/2013    Lab Results  Component Value Date   WBC 8.1 05/31/2023   NEUTROABS 5.8 05/31/2023   HGB 11.7 (L) 05/31/2023   HCT 36.3 05/31/2023   MCV 83.3 05/31/2023   PLT 313 05/31/2023   Lab Results  Component Value Date   LABCA2 24.0 12/06/2016     STUDIES: MM Breast Surgical Specimen  Result Date: 06/06/2023 CLINICAL DATA:  Status post Savi Scout localized LEFT breast lumpectomy. Patient is status post stereotactic guided biopsy which demonstrated ductal carcinoma in-situ (COIL clip, displaced). Calcifications are bracketed with SAVI scouts. EXAM: SPECIMEN RADIOGRAPH OF THE LEFT BREAST COMPARISON:  Previous exam(s). FINDINGS: Status post excision of the LEFT  breast. The 2 [employer] are present within the specimen. A COIL clip is not present within the specimen and was noted to be displaced laterally at time of biopsy. Intervening calcifications are noted specimen radiograph. IMPRESSION: Specimen radiograph of the LEFT breast. Electronically Signed   By: Meda Klinefelter M.D.   On: 06/06/2023 11:03   NM Sentinel Node Inj-No Rpt (Breast)  Result Date: 06/06/2023 Sulfur Colloid was injected by the Nuclear Medicine Technologist for sentinel lymph node localization.   MM LT RADIO FREQUENCY TAG LOC MAMMO GUIDE  Result Date: 05/23/2023 CLINICAL DATA:  Patient is status post stereotactic guided biopsy of the LEFT breast calcifications demonstrating ductal carcinoma in-situ (COIL clip, displaced). Patient presents for preoperative bracketing localization of calcifications. EXAM: NEEDLE LOCALIZATION OF THE LEFT BREAST WITH MAMMO GUIDANCE NEEDLE LOCALIZATION OF THE LEFT BREAST WITH MAMMO GUIDANCE COMPARISON:  Previous exam(s). FINDINGS: Patient presents for needle localization prior to lumpectomy. I met with the patient and we discussed the procedure of needle localization including benefits and alternatives. We discussed the high likelihood of a successful procedure. We discussed the risks of the procedure, including infection, bleeding, tissue injury, and further surgery. Informed, written consent was given was obtained from patient's guardian. The usual time-out protocol was performed immediately prior to the procedure. Using stereotactic tomosynthesis mammographic guidance, sterile technique, 1% lidocaine and a 10 cm SAVI SCOUT needle, the anterior extent of calcifications was localized using a superior approach. Using stereotactic tomosynthesis mammographic guidance, sterile technique, 1% lidocaine and a 10 cm SAVI SCOUT needle, the posterior extent of calcifications was localized using a superior approach. Subsequent 2 view mammogram was performed. SAVI  scouts are approximately 5.5 cm apart. COIL clip is displaced approximately 14 mm laterally. The images were marked for Dr. Claudine Mouton. IMPRESSION: 1. Radar reflector bracketed localization of the LEFT breast. No apparent complications. 2. SAVI scouts are approximately 5.5 cm apart Electronically Signed   By: Meda Klinefelter M.D.   On: 05/23/2023 16:50   MM LT RADIO FREQUENCY TAG EA ADD LESION LOC MAMMO GUIDE  Result Date: 05/23/2023 CLINICAL DATA:  Patient is status post stereotactic guided biopsy of  the LEFT breast calcifications demonstrating ductal carcinoma in-situ (COIL clip, displaced). Patient presents for preoperative bracketing localization of calcifications. EXAM: NEEDLE LOCALIZATION OF THE LEFT BREAST WITH MAMMO GUIDANCE NEEDLE LOCALIZATION OF THE LEFT BREAST WITH MAMMO GUIDANCE COMPARISON:  Previous exam(s). FINDINGS: Patient presents for needle localization prior to lumpectomy. I met with the patient and we discussed the procedure of needle localization including benefits and alternatives. We discussed the high likelihood of a successful procedure. We discussed the risks of the procedure, including infection, bleeding, tissue injury, and further surgery. Informed, written consent was given was obtained from patient's guardian. The usual time-out protocol was performed immediately prior to the procedure. Using stereotactic tomosynthesis mammographic guidance, sterile technique, 1% lidocaine and a 10 cm SAVI SCOUT needle, the anterior extent of calcifications was localized using a superior approach. Using stereotactic tomosynthesis mammographic guidance, sterile technique, 1% lidocaine and a 10 cm SAVI SCOUT needle, the posterior extent of calcifications was localized using a superior approach. Subsequent 2 view mammogram was performed. SAVI scouts are approximately 5.5 cm apart. COIL clip is displaced approximately 14 mm laterally. The images were marked for Dr. Claudine Mouton. IMPRESSION: 1. Radar  reflector bracketed localization of the LEFT breast. No apparent complications. 2. SAVI scouts are approximately 5.5 cm apart Electronically Signed   By: Meda Klinefelter M.D.   On: 05/23/2023 16:50    ASSESSMENT: History of stage IIa ER/PR positive HER-2 negative adenocarcinoma of the upper inner quadrant of right breast now with ER/PR negative DCIS of the left breast.   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  Although malignancy has a microinvasive component and is ER/PR negative, it is unlikely patient will tolerate adjuvant chemotherapy.  Given the patient's underlying schizophrenia, mastectomy without XRT may be a consideration.  Return to clinic 1 to 2 weeks after her surgery to discuss her final results. 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  Patient's new lesion is ER/PR negative, therefore she has been instructed to continue letrozole for a total of 10 years completing treatment in March 2025.  Bone health: Patient's most recent bone mineral density on Mar 16, 2021 reported T-score of -0.3 which is considered normal.    I spent a total of 30 minutes reviewing chart data, face-to-face evaluation with the patient, counseling and coordination of care as detailed above.  Patient expressed understanding and was in agreement with this plan. She also understands that She can call clinic at any time with any questions, concerns, or complaints.    Jeralyn Ruths, MD   06/20/2023 11:14 AM

## 2023-06-26 ENCOUNTER — Encounter: Payer: Self-pay | Admitting: *Deleted

## 2023-06-26 ENCOUNTER — Ambulatory Visit
Admission: RE | Admit: 2023-06-26 | Discharge: 2023-06-26 | Disposition: A | Discharge status: Home / Self Care | Payer: Medicare Other | Source: Ambulatory Visit | Attending: Radiation Oncology | Admitting: Radiation Oncology

## 2023-06-26 DIAGNOSIS — C50912 Malignant neoplasm of unspecified site of left female breast: Secondary | ICD-10-CM | POA: Diagnosis present

## 2023-06-26 DIAGNOSIS — Z171 Estrogen receptor negative status [ER-]: Secondary | ICD-10-CM | POA: Diagnosis present

## 2023-07-03 DIAGNOSIS — Z510 Encounter for antineoplastic radiation therapy: Secondary | ICD-10-CM | POA: Insufficient documentation

## 2023-07-03 DIAGNOSIS — C50312 Malignant neoplasm of lower-inner quadrant of left female breast: Secondary | ICD-10-CM | POA: Diagnosis present

## 2023-07-03 DIAGNOSIS — C50912 Malignant neoplasm of unspecified site of left female breast: Secondary | ICD-10-CM | POA: Insufficient documentation

## 2023-07-03 DIAGNOSIS — Z171 Estrogen receptor negative status [ER-]: Secondary | ICD-10-CM | POA: Insufficient documentation

## 2023-07-06 ENCOUNTER — Other Ambulatory Visit: Payer: Self-pay | Admitting: *Deleted

## 2023-07-06 DIAGNOSIS — C50211 Malignant neoplasm of upper-inner quadrant of right female breast: Secondary | ICD-10-CM

## 2023-07-09 ENCOUNTER — Ambulatory Visit: Payer: Medicare Other

## 2023-07-09 ENCOUNTER — Other Ambulatory Visit: Payer: Self-pay | Admitting: *Deleted

## 2023-07-09 ENCOUNTER — Ambulatory Visit: Admission: RE | Admit: 2023-07-09 | Payer: Medicare Other | Source: Ambulatory Visit

## 2023-07-10 ENCOUNTER — Ambulatory Visit: Payer: Medicare Other

## 2023-07-11 ENCOUNTER — Ambulatory Visit: Payer: Medicare Other

## 2023-07-12 ENCOUNTER — Ambulatory Visit: Payer: Medicare Other

## 2023-07-13 ENCOUNTER — Ambulatory Visit: Payer: Medicare Other

## 2023-07-16 ENCOUNTER — Ambulatory Visit: Payer: Medicare Other

## 2023-07-16 ENCOUNTER — Ambulatory Visit
Admission: RE | Admit: 2023-07-16 | Discharge: 2023-07-16 | Disposition: A | Discharge status: Home / Self Care | Payer: Medicare Other | Source: Ambulatory Visit | Attending: Radiation Oncology | Admitting: Radiation Oncology

## 2023-07-16 ENCOUNTER — Other Ambulatory Visit: Payer: Medicare Other

## 2023-07-17 ENCOUNTER — Ambulatory Visit: Payer: Medicare Other

## 2023-07-18 ENCOUNTER — Ambulatory Visit: Payer: Medicare Other

## 2023-07-19 ENCOUNTER — Other Ambulatory Visit: Payer: Self-pay | Admitting: *Deleted

## 2023-07-19 ENCOUNTER — Ambulatory Visit: Payer: Medicare Other

## 2023-07-19 MED ORDER — KETOCONAZOLE 2 % EX CREA: 1.0000 | TOPICAL_CREAM | Freq: Two times a day (BID) | CUTANEOUS | 0 refills | Status: AC

## 2023-07-20 ENCOUNTER — Ambulatory Visit: Payer: Medicare Other

## 2023-07-23 ENCOUNTER — Ambulatory Visit: Payer: Medicare Other

## 2023-07-23 ENCOUNTER — Encounter: Payer: Self-pay | Admitting: *Deleted

## 2023-07-23 ENCOUNTER — Other Ambulatory Visit: Payer: Medicare Other

## 2023-07-23 ENCOUNTER — Ambulatory Visit
Admission: RE | Admit: 2023-07-23 | Discharge: 2023-07-23 | Disposition: A | Discharge status: Home / Self Care | Payer: Medicare Other | Source: Ambulatory Visit | Attending: Radiation Oncology | Admitting: Radiation Oncology

## 2023-07-24 ENCOUNTER — Ambulatory Visit: Payer: Medicare Other

## 2023-07-25 ENCOUNTER — Ambulatory Visit: Payer: Medicare Other

## 2023-07-26 ENCOUNTER — Ambulatory Visit: Payer: Medicare Other

## 2023-07-27 ENCOUNTER — Ambulatory Visit: Payer: Medicare Other

## 2023-07-30 ENCOUNTER — Ambulatory Visit: Payer: Medicare Other

## 2023-07-30 ENCOUNTER — Other Ambulatory Visit: Payer: Medicare Other

## 2023-07-30 ENCOUNTER — Ambulatory Visit: Admission: RE | Admit: 2023-07-30 | Payer: Medicare Other | Source: Ambulatory Visit

## 2023-07-31 ENCOUNTER — Ambulatory Visit: Payer: Medicare Other

## 2023-08-01 ENCOUNTER — Ambulatory Visit: Payer: Medicare Other

## 2023-08-02 ENCOUNTER — Ambulatory Visit: Payer: Medicare Other

## 2023-08-03 ENCOUNTER — Ambulatory Visit: Payer: Medicare Other

## 2023-08-06 ENCOUNTER — Ambulatory Visit: Payer: Medicare Other

## 2023-08-06 ENCOUNTER — Other Ambulatory Visit: Payer: Medicare Other

## 2023-08-06 ENCOUNTER — Other Ambulatory Visit: Payer: Self-pay

## 2023-08-06 ENCOUNTER — Ambulatory Visit
Admission: RE | Admit: 2023-08-06 | Discharge: 2023-08-06 | Disposition: A | Discharge status: Home / Self Care | Payer: Medicare Other | Source: Ambulatory Visit | Attending: Radiation Oncology | Admitting: Radiation Oncology

## 2023-08-06 DIAGNOSIS — Z171 Estrogen receptor negative status [ER-]: Secondary | ICD-10-CM | POA: Insufficient documentation

## 2023-08-06 DIAGNOSIS — C50312 Malignant neoplasm of lower-inner quadrant of left female breast: Secondary | ICD-10-CM | POA: Insufficient documentation

## 2023-08-06 DIAGNOSIS — Z510 Encounter for antineoplastic radiation therapy: Secondary | ICD-10-CM | POA: Diagnosis not present

## 2023-08-06 LAB — RAD ONC ARIA SESSION SUMMARY
Course Elapsed Days: 0
Plan Fractions Treated to Date: 1
Plan Prescribed Dose Per Fraction: 3.5 Gy
Plan Total Fractions Prescribed: 10
Plan Total Prescribed Dose: 35 Gy
Reference Point Dosage Given to Date: 3.5 Gy
Reference Point Session Dosage Given: 3.5 Gy
Session Number: 1

## 2023-08-07 ENCOUNTER — Ambulatory Visit
Admission: RE | Admit: 2023-08-07 | Discharge: 2023-08-07 | Disposition: A | Discharge status: Home / Self Care | Payer: Medicare Other | Source: Ambulatory Visit | Attending: Radiation Oncology | Admitting: Radiation Oncology

## 2023-08-07 ENCOUNTER — Other Ambulatory Visit: Payer: Self-pay

## 2023-08-07 DIAGNOSIS — Z510 Encounter for antineoplastic radiation therapy: Secondary | ICD-10-CM | POA: Diagnosis not present

## 2023-08-07 LAB — RAD ONC ARIA SESSION SUMMARY
Course Elapsed Days: 1
Plan Fractions Treated to Date: 2
Plan Prescribed Dose Per Fraction: 3.5 Gy
Plan Total Fractions Prescribed: 10
Plan Total Prescribed Dose: 35 Gy
Reference Point Dosage Given to Date: 7 Gy
Reference Point Session Dosage Given: 3.5 Gy
Session Number: 2

## 2023-08-08 ENCOUNTER — Ambulatory Visit
Admission: RE | Admit: 2023-08-08 | Discharge: 2023-08-08 | Disposition: A | Discharge status: Home / Self Care | Payer: Medicare Other | Source: Ambulatory Visit | Attending: Radiation Oncology | Admitting: Radiation Oncology

## 2023-08-08 ENCOUNTER — Other Ambulatory Visit: Payer: Self-pay

## 2023-08-08 DIAGNOSIS — Z510 Encounter for antineoplastic radiation therapy: Secondary | ICD-10-CM | POA: Diagnosis not present

## 2023-08-08 LAB — RAD ONC ARIA SESSION SUMMARY
Course Elapsed Days: 2
Plan Fractions Treated to Date: 3
Plan Prescribed Dose Per Fraction: 3.5 Gy
Plan Total Fractions Prescribed: 10
Plan Total Prescribed Dose: 35 Gy
Reference Point Dosage Given to Date: 10.5 Gy
Reference Point Session Dosage Given: 3.5 Gy
Session Number: 3

## 2023-08-09 ENCOUNTER — Other Ambulatory Visit: Payer: Self-pay

## 2023-08-09 ENCOUNTER — Ambulatory Visit
Admission: RE | Admit: 2023-08-09 | Discharge: 2023-08-09 | Disposition: A | Discharge status: Home / Self Care | Payer: Medicare Other | Source: Ambulatory Visit | Attending: Radiation Oncology | Admitting: Radiation Oncology

## 2023-08-09 DIAGNOSIS — Z510 Encounter for antineoplastic radiation therapy: Secondary | ICD-10-CM | POA: Diagnosis not present

## 2023-08-09 LAB — RAD ONC ARIA SESSION SUMMARY
Course Elapsed Days: 3
Plan Fractions Treated to Date: 4
Plan Prescribed Dose Per Fraction: 3.5 Gy
Plan Total Fractions Prescribed: 10
Plan Total Prescribed Dose: 35 Gy
Reference Point Dosage Given to Date: 14 Gy
Reference Point Session Dosage Given: 3.5 Gy
Session Number: 4

## 2023-08-10 ENCOUNTER — Ambulatory Visit
Admission: RE | Admit: 2023-08-10 | Discharge: 2023-08-10 | Disposition: A | Discharge status: Home / Self Care | Payer: Medicare Other | Source: Ambulatory Visit | Attending: Radiation Oncology | Admitting: Radiation Oncology

## 2023-08-10 ENCOUNTER — Other Ambulatory Visit: Payer: Self-pay

## 2023-08-10 DIAGNOSIS — Z510 Encounter for antineoplastic radiation therapy: Secondary | ICD-10-CM | POA: Diagnosis not present

## 2023-08-10 LAB — RAD ONC ARIA SESSION SUMMARY
Course Elapsed Days: 4
Plan Fractions Treated to Date: 5
Plan Prescribed Dose Per Fraction: 3.5 Gy
Plan Total Fractions Prescribed: 10
Plan Total Prescribed Dose: 35 Gy
Reference Point Dosage Given to Date: 17.5 Gy
Reference Point Session Dosage Given: 3.5 Gy
Session Number: 5

## 2023-08-13 ENCOUNTER — Other Ambulatory Visit: Payer: Self-pay

## 2023-08-13 ENCOUNTER — Ambulatory Visit
Admission: RE | Admit: 2023-08-13 | Discharge: 2023-08-13 | Disposition: A | Discharge status: Home / Self Care | Payer: Medicare Other | Source: Ambulatory Visit | Attending: Radiation Oncology | Admitting: Radiation Oncology

## 2023-08-13 ENCOUNTER — Inpatient Hospital Stay: Payer: Medicare Other | Attending: Oncology

## 2023-08-13 DIAGNOSIS — Z510 Encounter for antineoplastic radiation therapy: Secondary | ICD-10-CM | POA: Diagnosis not present

## 2023-08-13 LAB — RAD ONC ARIA SESSION SUMMARY
Course Elapsed Days: 7
Plan Fractions Treated to Date: 6
Plan Prescribed Dose Per Fraction: 3.5 Gy
Plan Total Fractions Prescribed: 10
Plan Total Prescribed Dose: 35 Gy
Reference Point Dosage Given to Date: 21 Gy
Reference Point Session Dosage Given: 3.5 Gy
Session Number: 6

## 2023-08-14 ENCOUNTER — Other Ambulatory Visit: Payer: Self-pay

## 2023-08-14 ENCOUNTER — Ambulatory Visit
Admission: RE | Admit: 2023-08-14 | Discharge: 2023-08-14 | Disposition: A | Discharge status: Home / Self Care | Payer: Medicare Other | Source: Ambulatory Visit | Attending: Radiation Oncology | Admitting: Radiation Oncology

## 2023-08-14 DIAGNOSIS — Z510 Encounter for antineoplastic radiation therapy: Secondary | ICD-10-CM | POA: Diagnosis not present

## 2023-08-14 LAB — RAD ONC ARIA SESSION SUMMARY
Course Elapsed Days: 8
Plan Fractions Treated to Date: 7
Plan Prescribed Dose Per Fraction: 3.5 Gy
Plan Total Fractions Prescribed: 10
Plan Total Prescribed Dose: 35 Gy
Reference Point Dosage Given to Date: 24.5 Gy
Reference Point Session Dosage Given: 3.5 Gy
Session Number: 7

## 2023-08-15 ENCOUNTER — Other Ambulatory Visit: Payer: Self-pay

## 2023-08-15 ENCOUNTER — Ambulatory Visit
Admission: RE | Admit: 2023-08-15 | Discharge: 2023-08-15 | Disposition: A | Discharge status: Home / Self Care | Payer: Medicare Other | Source: Ambulatory Visit | Attending: Radiation Oncology | Admitting: Radiation Oncology

## 2023-08-15 DIAGNOSIS — Z510 Encounter for antineoplastic radiation therapy: Secondary | ICD-10-CM | POA: Diagnosis not present

## 2023-08-15 LAB — RAD ONC ARIA SESSION SUMMARY
Course Elapsed Days: 9
Plan Fractions Treated to Date: 8
Plan Prescribed Dose Per Fraction: 3.5 Gy
Plan Total Fractions Prescribed: 10
Plan Total Prescribed Dose: 35 Gy
Reference Point Dosage Given to Date: 28 Gy
Reference Point Session Dosage Given: 3.5 Gy
Session Number: 8

## 2023-08-16 ENCOUNTER — Other Ambulatory Visit: Payer: Self-pay

## 2023-08-16 ENCOUNTER — Ambulatory Visit
Admission: RE | Admit: 2023-08-16 | Discharge: 2023-08-16 | Disposition: A | Discharge status: Home / Self Care | Payer: Medicare Other | Source: Ambulatory Visit | Attending: Radiation Oncology | Admitting: Radiation Oncology

## 2023-08-16 DIAGNOSIS — Z510 Encounter for antineoplastic radiation therapy: Secondary | ICD-10-CM | POA: Diagnosis not present

## 2023-08-16 LAB — RAD ONC ARIA SESSION SUMMARY
Course Elapsed Days: 10
Plan Fractions Treated to Date: 9
Plan Prescribed Dose Per Fraction: 3.5 Gy
Plan Total Fractions Prescribed: 10
Plan Total Prescribed Dose: 35 Gy
Reference Point Dosage Given to Date: 31.5 Gy
Reference Point Session Dosage Given: 3.5 Gy
Session Number: 9

## 2023-08-17 ENCOUNTER — Encounter: Payer: Self-pay | Admitting: *Deleted

## 2023-08-17 ENCOUNTER — Ambulatory Visit
Admission: RE | Admit: 2023-08-17 | Discharge: 2023-08-17 | Disposition: A | Discharge status: Home / Self Care | Payer: Medicare Other | Source: Ambulatory Visit | Attending: Radiation Oncology | Admitting: Radiation Oncology

## 2023-08-17 ENCOUNTER — Other Ambulatory Visit: Payer: Self-pay

## 2023-08-17 DIAGNOSIS — Z510 Encounter for antineoplastic radiation therapy: Secondary | ICD-10-CM | POA: Diagnosis not present

## 2023-08-17 LAB — RAD ONC ARIA SESSION SUMMARY
Course Elapsed Days: 11
Plan Fractions Treated to Date: 10
Plan Prescribed Dose Per Fraction: 3.5 Gy
Plan Total Fractions Prescribed: 10
Plan Total Prescribed Dose: 35 Gy
Reference Point Dosage Given to Date: 35 Gy
Reference Point Session Dosage Given: 3.5 Gy
Session Number: 10

## 2023-09-20 ENCOUNTER — Other Ambulatory Visit: Payer: Medicare Other

## 2023-09-24 ENCOUNTER — Ambulatory Visit
Admission: RE | Admit: 2023-09-24 | Discharge: 2023-09-24 | Disposition: A | Discharge status: Home / Self Care | Payer: Medicare Other | Source: Ambulatory Visit | Attending: Radiation Oncology | Admitting: Radiation Oncology

## 2023-09-24 VITALS — BP 135/86 | HR 92 | Temp 97.0°F

## 2023-09-24 DIAGNOSIS — R21 Rash and other nonspecific skin eruption: Secondary | ICD-10-CM | POA: Diagnosis not present

## 2023-09-24 DIAGNOSIS — Z79811 Long term (current) use of aromatase inhibitors: Secondary | ICD-10-CM | POA: Diagnosis not present

## 2023-09-24 DIAGNOSIS — C50211 Malignant neoplasm of upper-inner quadrant of right female breast: Secondary | ICD-10-CM | POA: Insufficient documentation

## 2023-09-24 DIAGNOSIS — Z170 Estrogen receptor positive status [ER+]: Secondary | ICD-10-CM | POA: Insufficient documentation

## 2023-09-24 DIAGNOSIS — Z923 Personal history of irradiation: Secondary | ICD-10-CM | POA: Insufficient documentation

## 2023-09-24 DIAGNOSIS — R232 Flushing: Secondary | ICD-10-CM | POA: Insufficient documentation

## 2023-09-24 NOTE — Progress Notes (Signed)
Radiation Oncology Follow up Note  Name: Deborah Castillo   Date:   09/24/2023 MRN:  [id number] DOB: Mar 27, 1961    This 62 y.o. 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 The treatment was focused on the right breast. The patient is currently on letrozole. She reports a rash under the breast, which has been treated by a healthcare provider. The patient also reports experiencing mild hot flashes..  She was treated with accelerated partial breast radiation.  COMPLICATIONS OF TREATMENT: none  FOLLOW UP COMPLIANCE: keeps appointments   PHYSICAL EXAM:  BP 135/86   Pulse 92   Temp (!) 97 F (36.1 C)  Patient has extremely large pendulous breast.  Right breast has some thickening secondary to radiation.  No dominant masses noted in either breast no axillary or supraclavicular adenopathy is identified.  Well-developed well-nourished patient in NAD. HEENT reveals PERLA, EOMI, discs not visualized.  Oral cavity is clear. No oral mucosal lesions are identified. Neck is clear without evidence of cervical or supraclavicular adenopathy. Lungs are clear to A&P. Cardiac examination is essentially unremarkable with regular rate and rhythm without murmur rub or thrill. Abdomen is benign with no organomegaly or masses noted. Motor sensory and DTR levels are equal and symmetric in the upper and lower extremities. Cranial nerves II through XII are grossly intact. Proprioception is intact. No peripheral adenopathy or edema is identified. No motor or sensory levels are noted. Crude visual fields are within normal range.  RADIOLOGY RESULTS:  No current films for review  PLAN: Breast Cancer (Stage 1A PT1MI N0, M0) Status post wide local excision and whole breast radiation for extensive high grade DCIS with multifocal micro invasion in the right breast. Currently on Letrozole with mild hot flashes reported. -Continue Letrozole. -Follow-up in 6 months with a mammogram.  Skin Rash Noted under the breast, currently being treated by Miss Wallace Cullens. -Continue current treatment regimen.    Carmina Miller, MD

## 2023-09-25 ENCOUNTER — Inpatient Hospital Stay: Payer: Medicare Other | Admitting: Oncology

## 2023-12-13 ENCOUNTER — Other Ambulatory Visit: Payer: Medicare Other

## 2023-12-20 ENCOUNTER — Inpatient Hospital Stay: Payer: Medicare Other | Admitting: Oncology

## 2023-12-20 ENCOUNTER — Telehealth: Payer: Self-pay | Admitting: Oncology

## 2023-12-20 NOTE — Telephone Encounter (Signed)
Spoke to pt legal guardian, Candice, regarding pt missed appointments. She is going to reach out to her Child psychotherapist and have the home pt is living in to call back to reschedule her appointments.

## 2024-04-07 ENCOUNTER — Ambulatory Visit
Admission: RE | Admit: 2024-04-07 | Discharge: 2024-04-07 | Disposition: A | Discharge status: Home / Self Care | Payer: Medicare Other | Source: Ambulatory Visit | Attending: Radiation Oncology | Admitting: Radiation Oncology

## 2024-04-07 ENCOUNTER — Encounter: Payer: Self-pay | Admitting: Radiation Oncology

## 2024-04-07 VITALS — BP 145/87 | HR 83

## 2024-04-07 DIAGNOSIS — C50312 Malignant neoplasm of lower-inner quadrant of left female breast: Secondary | ICD-10-CM | POA: Insufficient documentation

## 2024-04-07 DIAGNOSIS — Z79811 Long term (current) use of aromatase inhibitors: Secondary | ICD-10-CM | POA: Diagnosis not present

## 2024-04-07 DIAGNOSIS — C50211 Malignant neoplasm of upper-inner quadrant of right female breast: Secondary | ICD-10-CM

## 2024-04-07 DIAGNOSIS — Z923 Personal history of irradiation: Secondary | ICD-10-CM | POA: Diagnosis not present

## 2024-04-07 DIAGNOSIS — Z170 Estrogen receptor positive status [ER+]: Secondary | ICD-10-CM | POA: Diagnosis not present

## 2024-04-07 NOTE — Progress Notes (Signed)
 Radiation Oncology Follow up Note  Name: Deborah Castillo   Date:   04/07/2024 MRN:  [id number] DOB: 06/20/1961    This 63 y.o. 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  REFERRING PROVIDER: Tobi Fortes, NP  HPI: Patient is a 63 year old female now seen out 7 months having completed whole breast radiation to her right breast for extensive high-grade ductal carcinoma in situ with marked multifocal microinvasion stage Ia ER positive.  Seen today in follow-up she is doing well still has some rash under her breasts although her breasts are extremely large and pendulous.  She also has some thickening of the breast which we expect after surgery and radiation treatments..  She is currently on Femara  tolerating that well without side effect.  She has not yet had follow-up mammograms.  COMPLICATIONS OF TREATMENT: none  FOLLOW UP COMPLIANCE: keeps appointments   PHYSICAL EXAM:  BP (!) 145/87   Pulse 83 patient's breasts are extremely large and pendulous.  There is some thickening of the right breast as well as some residual hyperpigmentation noted. Lungs are clear to A&P cardiac examination essentially unremarkable with regular rate and rhythm. No dominant mass or nodularity is noted in either breast in 2 positions examined. Incision is well-healed. No axillary or supraclavicular adenopathy is appreciated. Cosmetic result is good.  RADIOLOGY RESULTS: No current films for review  PLAN: Present time patient is doing well 7 months out with no evidence clinically of disease.  I am going to turn follow-up care over to medical oncology.  Patient has a difficult time with transportation and multiple physician visits.  I would be happy to reevaluate her anytime should that be indicated.  I would like to take this opportunity to thank you  for allowing me to participate in the care of your patient.Glenis Langdon, MD

## 2024-10-08 ENCOUNTER — Ambulatory Visit: Admitting: Radiation Oncology
